# Patient Record
Sex: Female | Born: 1950 | Race: White | Hispanic: No | Marital: Single | Smoking: Never smoker
Health system: Southern US, Community
[De-identification: ages and names within clinical notes are randomized; demographics above are authoritative.]

## PROBLEM LIST (undated history)

## (undated) DIAGNOSIS — I10 Essential (primary) hypertension: Secondary | ICD-10-CM

## (undated) DIAGNOSIS — F319 Bipolar disorder, unspecified: Secondary | ICD-10-CM

## (undated) DIAGNOSIS — M1990 Unspecified osteoarthritis, unspecified site: Secondary | ICD-10-CM

## (undated) HISTORY — PX: ABDOMINAL HYSTERECTOMY: SHX81

---

## 2003-04-15 ENCOUNTER — Encounter: Payer: Self-pay | Admitting: Emergency Medicine

## 2003-04-15 ENCOUNTER — Emergency Department (HOSPITAL_COMMUNITY): Admission: EM | Admit: 2003-04-15 | Discharge: 2003-04-15 | Payer: Self-pay | Admitting: Emergency Medicine

## 2017-11-04 ENCOUNTER — Emergency Department (HOSPITAL_COMMUNITY): Payer: Medicare Other

## 2017-11-04 ENCOUNTER — Other Ambulatory Visit: Payer: Self-pay

## 2017-11-04 ENCOUNTER — Observation Stay (HOSPITAL_COMMUNITY)
Admission: EM | Admit: 2017-11-04 | Discharge: 2017-11-05 | Disposition: A | Discharge status: Home / Self Care | Payer: Medicare Other | Attending: Family Medicine | Admitting: Family Medicine

## 2017-11-04 ENCOUNTER — Encounter (HOSPITAL_COMMUNITY): Payer: Self-pay

## 2017-11-04 DIAGNOSIS — E785 Hyperlipidemia, unspecified: Secondary | ICD-10-CM

## 2017-11-04 DIAGNOSIS — F319 Bipolar disorder, unspecified: Secondary | ICD-10-CM

## 2017-11-04 DIAGNOSIS — I10 Essential (primary) hypertension: Secondary | ICD-10-CM | POA: Diagnosis not present

## 2017-11-04 DIAGNOSIS — R4781 Slurred speech: Secondary | ICD-10-CM | POA: Insufficient documentation

## 2017-11-04 DIAGNOSIS — R531 Weakness: Secondary | ICD-10-CM | POA: Diagnosis present

## 2017-11-04 DIAGNOSIS — G459 Transient cerebral ischemic attack, unspecified: Principal | ICD-10-CM | POA: Diagnosis present

## 2017-11-04 DIAGNOSIS — R110 Nausea: Secondary | ICD-10-CM | POA: Insufficient documentation

## 2017-11-04 DIAGNOSIS — R29810 Facial weakness: Secondary | ICD-10-CM | POA: Insufficient documentation

## 2017-11-04 DIAGNOSIS — F419 Anxiety disorder, unspecified: Secondary | ICD-10-CM

## 2017-11-04 HISTORY — DX: Bipolar disorder, unspecified: F31.9

## 2017-11-04 HISTORY — DX: Essential (primary) hypertension: I10

## 2017-11-04 HISTORY — DX: Unspecified osteoarthritis, unspecified site: M19.90

## 2017-11-04 LAB — APTT: APTT: 32 s (ref 24–36)

## 2017-11-04 LAB — COMPREHENSIVE METABOLIC PANEL
ALBUMIN: 3.6 g/dL (ref 3.5–5.0)
ALT: 16 U/L (ref 14–54)
AST: 17 U/L (ref 15–41)
Alkaline Phosphatase: 56 U/L (ref 38–126)
Anion gap: 9 (ref 5–15)
BUN: 19 mg/dL (ref 6–20)
CHLORIDE: 102 mmol/L (ref 101–111)
CO2: 27 mmol/L (ref 22–32)
CREATININE: 1.31 mg/dL — AB (ref 0.44–1.00)
Calcium: 9.7 mg/dL (ref 8.9–10.3)
GFR calc non Af Amer: 42 mL/min — ABNORMAL LOW (ref >60)
GFR, EST AFRICAN AMERICAN: 48 mL/min — AB (ref >60)
GLUCOSE: 83 mg/dL (ref 65–99)
Potassium: 3.5 mmol/L (ref 3.5–5.1)
SODIUM: 138 mmol/L (ref 135–145)
Total Bilirubin: 0.4 mg/dL (ref 0.3–1.2)
Total Protein: 7.1 g/dL (ref 6.5–8.1)

## 2017-11-04 LAB — CBC
HCT: 42.3 % (ref 36.0–46.0)
Hemoglobin: 13.5 g/dL (ref 12.0–15.0)
MCH: 30.2 pg (ref 26.0–34.0)
MCHC: 31.9 g/dL (ref 30.0–36.0)
MCV: 94.6 fL (ref 78.0–100.0)
PLATELETS: 302 10*3/uL (ref 150–400)
RBC: 4.47 MIL/uL (ref 3.87–5.11)
RDW: 12.4 % (ref 11.5–15.5)
WBC: 13.1 10*3/uL — AB (ref 4.0–10.5)

## 2017-11-04 LAB — I-STAT CHEM 8, ED
BUN: 23 mg/dL — ABNORMAL HIGH (ref 6–20)
CHLORIDE: 99 mmol/L — AB (ref 101–111)
Calcium, Ion: 1.14 mmol/L — ABNORMAL LOW (ref 1.15–1.40)
Creatinine, Ser: 1.3 mg/dL — ABNORMAL HIGH (ref 0.44–1.00)
GLUCOSE: 82 mg/dL (ref 65–99)
HCT: 42 % (ref 36.0–46.0)
Hemoglobin: 14.3 g/dL (ref 12.0–15.0)
POTASSIUM: 3.6 mmol/L (ref 3.5–5.1)
Sodium: 141 mmol/L (ref 135–145)
TCO2: 32 mmol/L (ref 22–32)

## 2017-11-04 LAB — DIFFERENTIAL
BASOS ABS: 0.1 10*3/uL (ref 0.0–0.1)
BASOS PCT: 1 %
Eosinophils Absolute: 0.6 10*3/uL (ref 0.0–0.7)
Eosinophils Relative: 5 %
Lymphocytes Relative: 22 %
Lymphs Abs: 2.9 10*3/uL (ref 0.7–4.0)
Monocytes Absolute: 0.9 10*3/uL (ref 0.1–1.0)
Monocytes Relative: 7 %
NEUTROS ABS: 8.4 10*3/uL — AB (ref 1.7–7.7)
NEUTROS PCT: 65 %

## 2017-11-04 LAB — I-STAT TROPONIN, ED: Troponin i, poc: 0 ng/mL (ref 0.00–0.08)

## 2017-11-04 LAB — PROTIME-INR
INR: 0.97
PROTHROMBIN TIME: 12.7 s (ref 11.4–15.2)

## 2017-11-04 MED ORDER — LOSARTAN POTASSIUM-HCTZ 100-25 MG PO TABS: 1.0000 | ORAL_TABLET | Freq: Every day | ORAL | Status: DC

## 2017-11-04 MED ORDER — SODIUM CHLORIDE 0.9 % IV SOLN
INTRAVENOUS | Status: DC
  Administered 2017-11-05: 01:00:00 via INTRAVENOUS

## 2017-11-04 MED ORDER — FAMOTIDINE IN NACL 20-0.9 MG/50ML-% IV SOLN
20.0000 mg | Freq: Once | INTRAVENOUS | Status: AC
  Administered 2017-11-04: 20 mg via INTRAVENOUS
  Filled 2017-11-04: qty 50

## 2017-11-04 MED ORDER — STROKE: EARLY STAGES OF RECOVERY BOOK
Freq: Once | Status: AC
  Administered 2017-11-05: 01:00:00
  Filled 2017-11-04: qty 1

## 2017-11-04 MED ORDER — SENNOSIDES-DOCUSATE SODIUM 8.6-50 MG PO TABS: 1.0000 | ORAL_TABLET | Freq: Every evening | ORAL | Status: DC | PRN

## 2017-11-04 MED ORDER — LAMOTRIGINE 100 MG PO TABS
200.0000 mg | ORAL_TABLET | Freq: Every day | ORAL | Status: DC
  Administered 2017-11-05: 200 mg via ORAL
  Filled 2017-11-04: qty 2

## 2017-11-04 MED ORDER — HYDROCHLOROTHIAZIDE 25 MG PO TABS
25.0000 mg | ORAL_TABLET | Freq: Every day | ORAL | Status: DC
  Administered 2017-11-05 (×2): 25 mg via ORAL
  Filled 2017-11-04 (×2): qty 1

## 2017-11-04 MED ORDER — ENOXAPARIN SODIUM 40 MG/0.4ML ~~LOC~~ SOLN
40.0000 mg | SUBCUTANEOUS | Status: DC
  Administered 2017-11-05: 40 mg via SUBCUTANEOUS
  Filled 2017-11-04: qty 0.4

## 2017-11-04 MED ORDER — TRAZODONE HCL 100 MG PO TABS
200.0000 mg | ORAL_TABLET | Freq: Every day | ORAL | Status: DC
Start: 2017-11-04 — End: 2017-11-05
  Administered 2017-11-05: 200 mg via ORAL
  Filled 2017-11-04: qty 2

## 2017-11-04 MED ORDER — ACETAMINOPHEN 650 MG RE SUPP: 650.0000 mg | RECTAL | Status: DC | PRN

## 2017-11-04 MED ORDER — SIMVASTATIN 20 MG PO TABS
20.0000 mg | ORAL_TABLET | Freq: Every day | ORAL | Status: DC
  Administered 2017-11-05: 20 mg via ORAL
  Filled 2017-11-04: qty 1

## 2017-11-04 MED ORDER — HYDROCHLOROTHIAZIDE 25 MG PO TABS: 25.0000 mg | ORAL_TABLET | Freq: Every day | ORAL | Status: DC

## 2017-11-04 MED ORDER — ASPIRIN EC 325 MG PO TBEC
325.0000 mg | DELAYED_RELEASE_TABLET | Freq: Once | ORAL | Status: DC
  Filled 2017-11-04: qty 1

## 2017-11-04 MED ORDER — ASPIRIN EC 81 MG PO TBEC
81.0000 mg | DELAYED_RELEASE_TABLET | Freq: Every day | ORAL | Status: DC
  Filled 2017-11-04: qty 1

## 2017-11-04 MED ORDER — ACETAMINOPHEN 325 MG PO TABS
650.0000 mg | ORAL_TABLET | ORAL | Status: DC | PRN
  Filled 2017-11-04: qty 2

## 2017-11-04 MED ORDER — ACETAMINOPHEN 160 MG/5ML PO SOLN: 650.0000 mg | ORAL | Status: DC | PRN

## 2017-11-04 MED ORDER — LORAZEPAM 2 MG/ML IJ SOLN
0.5000 mg | Freq: Once | INTRAMUSCULAR | Status: AC
  Administered 2017-11-04: 0.5 mg via INTRAVENOUS
  Filled 2017-11-04: qty 1

## 2017-11-04 MED ORDER — VENLAFAXINE HCL ER 75 MG PO CP24
150.0000 mg | ORAL_CAPSULE | Freq: Every day | ORAL | Status: DC
  Administered 2017-11-05: 150 mg via ORAL
  Filled 2017-11-04: qty 2
  Filled 2017-11-04: qty 1

## 2017-11-04 NOTE — ED Provider Notes (Signed)
[facility] Provider Note   CSN: [id number] Arrival date & time: 11/04/17  1726   An emergency department physician performed an initial assessment on this suspected stroke patient at 1746.  History   Chief Complaint Chief Complaint  Patient presents with  . Code Stroke    HPI Lisa LastLinda M Enfield is a 66 y.o. female.  Lisa Brewer is a 66 y.o. Female who presented to the emergency department as a code stroke.  Patient presented to the emergency department complaining of left-sided arm and leg weakness and tingling for the past 30 minutes.  Patient also reports she may have had some trouble speaking as she believes her sister was having trouble understanding her. RN in triage noted some left sided facial droop with some left arm and leg weakness.  Patient reports she is currently feeling back around her baseline.  She tells me she was trying to check out at the grocery store with her sister when she began feeling like she could not move her left arm and leg like it was supposed to.  She reports her left leg was not doing what she was telling it to do.  She reports she does have some arthritic pain, and currently feels around her baseline.  She also complains of some slight nausea due to being upset and stressed about coming to the emergency department.  She tells me she usually takes Zantac with relief of her symptoms. She denies history of stroke.  She is not on anticoagulants.  She does not take aspirin daily.  She is from outside the area.  She denies fevers, recent illness, coughing, chest pain, shortness of breath, vomiting, diarrhea, rashes, changes to her vision.    The history is provided by the patient and medical records. No language interpreter was used.    Past Medical History:  Diagnosis Date  . Arthritis   . Bipolar 1 disorder (HCC)   . Hypertension     There are no active problems to display for this patient.   Past Surgical  History:  Procedure Laterality Date  . ABDOMINAL HYSTERECTOMY      OB History    No data available       Home Medications    Prior to Admission medications   Not on File    Family History No family history on file.  Social History Social History   Tobacco Use  . Smoking status: Never Smoker  . Smokeless tobacco: Never Used  Substance Use Topics  . Alcohol use: No    Frequency: Never  . Drug use: No     Allergies   Codeine and Penicillins   Review of Systems Review of Systems  Constitutional: Negative for chills and fever.  HENT: Negative for congestion and sore throat.   Eyes: Negative for visual disturbance.  Respiratory: Negative for cough and shortness of breath.   Cardiovascular: Negative for chest pain.  Gastrointestinal: Negative for abdominal pain, diarrhea, nausea and vomiting.  Genitourinary: Negative for dysuria.  Musculoskeletal: Negative for back pain and neck pain.  Skin: Negative for rash.  Neurological: Positive for weakness (resolved ) and headaches. Negative for dizziness, syncope and light-headedness.     Physical Exam Updated Vital Signs BP (!) 166/98 (BP Location: Left Arm)   Pulse 79   Temp 97.9 F (36.6 C) (Oral)   Resp 16   Ht 5\' 2"  (1.575 m)   Wt 90.7 kg (200 lb)   SpO2 100%  BMI 36.58 kg/m   Physical Exam  Constitutional: She is oriented to person, place, and time. She appears well-developed and well-nourished. No distress.  HENT:  Head: Normocephalic and atraumatic.  Mouth/Throat: Oropharynx is clear and moist.  Eyes: Conjunctivae and EOM are normal. Pupils are equal, round, and reactive to light. Right eye exhibits no discharge. Left eye exhibits no discharge.  Neck: Neck supple. No JVD present. No tracheal deviation present.  Cardiovascular: Normal rate, regular rhythm, normal heart sounds and intact distal pulses. Exam reveals no gallop and no friction rub.  No murmur heard. Pulmonary/Chest: Effort normal and  breath sounds normal. No respiratory distress. She has no wheezes. She has no rales.  Abdominal: Soft. There is no tenderness.  Musculoskeletal: She exhibits no edema or deformity.  Lymphadenopathy:    She has no cervical adenopathy.  Neurological: She is alert and oriented to person, place, and time. No cranial nerve deficit or sensory deficit. She exhibits normal muscle tone. Coordination normal.  Patient is alert and oriented 3. Speech is clear and coherent. Cranial nerves are intact. Sensation is intact to bilateral upper and lower extremities.  Some difficulty with leg raise on the left, which patient reports is due to pain.  She reports this is at her baseline.  She is good strength plantar dorsiflexion bilaterally.  Upper extremities have good strength bilaterally.  Good grip strength bilaterally.  EOMs are intact.  Vision is grossly intact.  Finger to nose intact bilaterally.  No pronator drift.  Skin: Skin is warm and dry. Capillary refill takes less than 2 seconds. No rash noted. She is not diaphoretic. No erythema. No pallor.  Psychiatric: She has a normal mood and affect. Her behavior is normal.  Nursing note and vitals reviewed.    ED Treatments / Results  Labs (all labs ordered are listed, but only abnormal results are displayed) Labs Reviewed  CBC - Abnormal; Notable for the following components:      Result Value   WBC 13.1 (*)    All other components within normal limits  DIFFERENTIAL - Abnormal; Notable for the following components:   Neutro Abs 8.4 (*)    All other components within normal limits  COMPREHENSIVE METABOLIC PANEL - Abnormal; Notable for the following components:   Creatinine, Ser 1.31 (*)    GFR calc non Af Amer 42 (*)    GFR calc Af Amer 48 (*)    All other components within normal limits  I-STAT CHEM 8, ED - Abnormal; Notable for the following components:   Chloride 99 (*)    BUN 23 (*)    Creatinine, Ser 1.30 (*)    Calcium, Ion 1.14 (*)    All  other components within normal limits  PROTIME-INR  APTT  I-STAT TROPONIN, ED  CBG MONITORING, ED    EKG  EKG Interpretation None       Radiology Ct Head Code Stroke Wo Contrast  Result Date: 11/04/2017 CLINICAL DATA:  Code stroke.  LEFT-sided weakness and facial droop. EXAM: CT HEAD WITHOUT CONTRAST TECHNIQUE: Contiguous axial images were obtained from the base of the skull through the vertex without intravenous contrast. COMPARISON:  None. FINDINGS: BRAIN: No intraparenchymal hemorrhage, mass effect nor midline shift. The ventricles and sulci are normal for age. Patchy supratentorial white matter hypodensities less than expected for patient's age, though non-specific are most compatible with chronic small vessel ischemic disease. 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 No acute large vascular territory infarcts. No abnormal extra-axial fluid collections.  Basal cisterns are patent. VASCULAR: Mild calcific atherosclerosis of the carotid siphons. No dense MCA. SKULL: No skull fracture. No significant scalp soft tissue swelling. SINUSES/ORBITS: The mastoid air-cells and included paranasal sinuses are well-aerated.The included ocular globes and orbital contents are non-suspicious. OTHER: None. ASPECTS [facility]([place] Stroke Program Early CT Score) - Ganglionic level infarction (caudate, lentiform nuclei, internal capsule, insula, M1-M3 cortex): 7 - Supraganglionic infarction (M4-M6 cortex): 3 Total score (0-10 with 10 being normal): 10 IMPRESSION: 1. 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 2. ASPECTS is 10. 3. Acute findings text paged to Dr.Lindzen, Neurology via AMION secure system on 11/04/2017 at 6:07 pm. Electronically Signed   By: Awilda Metroourtnay  Bloomer M.D.   On: 11/04/2017 18:08    Procedures Procedures (including critical care time)  Medications Ordered in ED Medications  famotidine (PEPCID) IVPB 20 mg premix (20 mg Intravenous New  Bag/Given 11/04/17 1837)  LORazepam (ATIVAN) injection 0.5 mg (not administered)     Initial Impression / Assessment and Plan / ED Course  I have reviewed the triage vital signs and the nursing notes.  Pertinent labs & imaging results that were available during my care of the patient were reviewed by me and considered in my medical decision making (see chart for details).    This is a 66 y.o. Female who presented to the emergency department as a code stroke.  Patient presented to the emergency department complaining of left-sided arm and leg weakness and tingling for the past 30 minutes.  Patient also reports she may have had some trouble speaking as she believes her sister was having trouble understanding her. RN in triage noted some left sided facial droop with some left arm and leg weakness.  Patient reports she is currently feeling back around her baseline.  She tells me she was trying to check out at the grocery store with her sister when she began feeling like she could not move her left arm and leg like it was supposed to.  She reports her left leg was not doing what she was telling it to do.  She reports she does have some arthritic pain, and currently feels around her baseline.   Patient's initial NIH stroke scale is 3.  On my exam patient is afebrile nontoxic-appearing.  No evidence of any focal neurological deficits.  She does have some difficulty with testing strength to her left leg.  Patient reports this is her baseline and is due to pain and arthritis.  She reports earlier her left leg was not doing what she told it to do.  No evidence of any focal neurological deficits on my exam.  CT head without contrast shows an old appearing small right cerebellar infarct and otherwise a normal noncontrast head CT. Blood work is remarkable for a creatinine of 1.30.  No baseline to compare.  Patient is from out of the area. Neurologist Dr. Otelia LimesLindzen recommends TIA workup and admission.  Patient  agrees with plan for further workup.  I consulted with family medicine teaching service who accepted the patient for admission.  This patient was discussed with Dr. Verdie MosherLiu who agrees with assessment and plan.   Final Clinical Impressions(s) / ED Diagnoses   Final diagnoses:  TIA (transient ischemic attack)    ED Discharge Orders    None       Everlene FarrierDansie, Gilda Abboud, PA-C 11/04/17 1857    Lavera GuiseLiu, Dana Duo, MD 11/05/17 1415

## 2017-11-04 NOTE — ED Notes (Signed)
Pt will be brought from MRI to Pod F. MRI aware

## 2017-11-04 NOTE — H&P (Signed)
Family Medicine Teaching [facility] Admission History and Physical Service Pager: [phone]  Patient name: Lisa Brewer Medical record number: [id number] Date of birth: [birth year] Age: 66 y.o. Gender: female  Primary Care Provider: No primary care provider on file. Consultants: neurology  Code Status: partial; will take CPR and meds, refuses intubation. **Refuses blood transfusions** Confirmed this admission  Chief Complaint: L face, arm, leg weakness  Assessment and Plan: Lisa Brewer is a 66 y.o. female presenting with focal left sided arm and leg paresthesias and weakness and left sided facial droop . PMH is significant for arthritis, bipolar 1 disorder, HLD and HTN   TIA vs Stroke, new  Presents with Left facial paresthesias and ?facial droop, slurred speech noted by family member, Left arm and leg paresthesias and weakness. Came to ED as code stroke, Neuro on board. MR brain demonstrates no acute intracranial process, though mildly motion degraded. 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 CT head showing old right cerebellar infarct but is otherwise normal. RF for stroke include tobacco history, HTN, HLD. BP to 166/98 upon admission. Neurology consulted, recommending inpatient admission for TIA work up.  - admit as telemetry under attending Dr. Lum Babe  - vitals per floor protocol - neuro following; appreciate recs - monitor on telemetry - echo - carotid dopplers - risk strat labs: hgb A1C, lipid, TSH - swallow eval - completed in ED - PT/OT - continue home statin - ASA 81 mg Qd  AKI - Cr 1.30 on admission. No previous Cr readings to determine baseline. Mildly hypervolemic with 1+edema LE bilaterally, patient says is baseline. - gentle fluids 50 cc/hr x 12 hours - monitor on daily BMET  HTN - BP elevated 166/98 on admission. No evidence of new ischemia on imaging, and no mention of permissive HTN in neuro note. Will continue home antihypertensive meds to be restarted in AM. -  continue HCTZ 25 mg, hold lisinopril for AKI  Arthritis - Holding home meloxicam for AKI. - can add back as AKI resolves - tylenol PRN pain in the meantime  Bipolar 1 disorder/Fibromyalgia/Insomnia  - home meds include effexor 150 mg PO daily with breakfast, trazodone 200 mg daily at bedtime, lamotrigine 200 mg daily at bedtime. - continue home psych meds - hold home tramadol, can add back if requested  FEN/GI: NPO pending swallow study  Prophylaxis: lovenox  Disposition: admit to neuro/tele obs  History of Present Illness:  Lisa Brewer is a 66 y.o. female presenting with left-sided facial paresthesias and facial droop, left-sided arm and leg paresthesias and weakness. Patient was in her usual state of health until this afternoon when she noticed sudden onset of left-sided paresthesias and weakness. She is with her sister at the time of onset and her sister noted slurred speech, though her sentences still made sense. The patient felt slightly confused/out of it and dizzy during this time, she had nausea and felt like she might pass out. The entire episode lasted 35-40 minutes per the patient. She had no dilatations, no chest pain, no loss of urine, no loss of consciousness. She endorses feeling generally tired over the last few days. She denies history of known stroke in the past, stating that she's had similar symptoms about 2 years ago that went away in about 15 minutes. She has no known history of heart disease. Denies vomiting. Denies changes in urination such as urinary frequency or urgency. She denies constipation, notes 2 episodes of soft stools in the last two days.  Review Of Systems:  Per HPI.  ROS  Patient Active Problem List   Diagnosis Date Noted  . TIA (transient ischemic attack) 11/04/2017    Past Medical History: Past Medical History:  Diagnosis Date  . Arthritis   . Bipolar 1 disorder (HCC)   . Hypertension     Past Surgical History: Past Surgical History:   Procedure Laterality Date  . ABDOMINAL HYSTERECTOMY      Social History: Social History   Tobacco Use  . Smoking status: Previous smoker, quit 8 years ago   . Smokeless tobacco: Never Used  Substance Use Topics  . Alcohol use: No    Frequency: Never  . Drug use: No     Family History: No family history on file.   Allergies and Medications: Allergies  Allergen Reactions  . Codeine Anaphylaxis and Rash  . Penicillins Other (See Comments)    Has patient had a PCN reaction causing immediate rash, facial/tongue/throat swelling, SOB or lightheadedness with hypotension: No Has patient had a PCN reaction causing severe rash involving mucus membranes or skin necrosis: No Has patient had a PCN reaction that required hospitalization: No Has patient had a PCN reaction occurring within the last 10 years: No If all of the above answers are "NO", then may proceed with Cephalosporin use.   No current facility-administered medications on file prior to encounter.    Current Outpatient Medications on File Prior to Encounter  Medication Sig Dispense Refill  . Cholecalciferol (VITAMIN D PO) Take 2 tablets by mouth.     . lamoTRIgine (LAMICTAL) 200 MG tablet Take 200 mg by mouth at bedtime.     Marland Kitchen. losartan-hydrochlorothiazide (HYZAAR) 100-25 MG tablet Take 1 tablet by mouth daily.    . meloxicam (MOBIC) 15 MG tablet Take 15 mg by mouth daily.    . simvastatin (ZOCOR) 20 MG tablet Take 20 mg by mouth daily.    . TraMADol HCl 100 MG CP24 Take 100 mg by mouth daily as needed.    . traZODone (DESYREL) 100 MG tablet Take 200 mg by mouth at bedtime.    Marland Kitchen. venlafaxine XR (EFFEXOR XR) 150 MG 24 hr capsule Take 150 mg by mouth daily with breakfast.      Objective: BP (!) 172/76 (BP Location: Left Arm)   Pulse 70   Temp 97.9 F (36.6 C) (Oral)   Resp 18   Ht 5\' 2"  (1.575 m)   Wt 200 lb (90.7 kg)   SpO2 100%   BMI 36.58 kg/m  Exam: General: NAD, rests comfortably in bed, speaks to us  appropriately Eyes: PERRL, EOMI, no conjunctival erythema or pallor, no scleral icterus ENTM: MMM, no pharyngeal erythema or exudate Neck: Full range of motion, no lymphadenopathy Cardiovascular: Regular rate and rhythm, no murmurs rubs or gallops Respiratory: Normal work of breathing, good effort, clear to auscultation bilaterally Gastrointestinal: Soft, nondistended, mildly tender in epigastric region to deep palpation MSK: Moves 4 extremities equally Derm: No rashes or lesions appreciated Neuro: CN II through XII intact, + decreased sensation endorsed over left face, arm, leg. 4/5 grip strength in left compared to 5/5 in right. 4/5 LUE strength, 5/5 LUE strength. 4/5 LLE strength, 5/5 LLE strength Psych: AAOx4, appropriate affect, thought process linear, speech clear and unslurred  Labs and Imaging: CBC BMET  Recent Labs  Lab 11/04/17 1735 11/04/17 1754  WBC 13.1*  --   HGB 13.5 14.3  HCT 42.3 42.0  PLT 302  --    Recent Labs  Lab 11/04/17 1735 11/04/17  1754  NA 138 141  K 3.5 3.6  CL 102 99*  CO2 27  --   BUN 19 23*  CREATININE 1.31* 1.30*  GLUCOSE 83 82  CALCIUM 9.7  --      EKG  - QTc 495, RA enlarged, old anteroseptal infarct, old  Mr Angiogram Head Wo Contrast  Result Date: 11/04/2017 CLINICAL DATA:  LEFT-sided weakness and tingling for 30 minutes. EXAM: MRI HEAD WITHOUT CONTRAST MRA HEAD WITHOUT CONTRAST TECHNIQUE: Multiplanar, multiecho pulse sequences of the brain and surrounding structures were obtained without intravenous contrast. Angiographic images of the head were obtained using MRA technique without contrast. COMPARISON:  CT HEAD November 04, 2017 at 1759 hours FINDINGS: MRI HEAD FINDINGS- mildly motion degraded examination. BRAIN: No reduced diffusion to suggest acute ischemia or hyperacute demyelination. No susceptibility artifact to suggest hemorrhage. The ventricles and sulci are normal for patient's age. Old small RIGHT cerebellar infarcts. Scattered  subcentimeter supratentorial white matter FLAIR T2 hyperintensities compatible with mild chronic small vessel ischemic disease, normal for age. No suspicious parenchymal signal, mass or mass effect. No abnormal extra-axial fluid collections. VASCULAR: Normal major intracranial vascular flow voids present at skull base. SKULL AND UPPER CERVICAL SPINE: No abnormal sellar expansion. No suspicious calvarial bone marrow signal. Craniocervical junction maintained. SINUSES/ORBITS: The mastoid air-cells and included paranasal sinuses are well-aerated. The included ocular globes and orbital contents are non-suspicious. OTHER: None. MRA HEAD FINDINGS- mildly motion degraded examination. ANTERIOR CIRCULATION: Normal flow related enhancement of the included cervical, petrous, cavernous and supraclinoid internal carotid arteries. Patent anterior communicating artery. Patent anterior and middle cerebral arteries, mild luminal regularity. No large vessel occlusion, flow limiting stenosis, aneurysm. POSTERIOR CIRCULATION: vertebral artery is dominant. Basilar artery is patent, with normal flow related enhancement of the main branch vessels. Patent posterior cerebral arteries. Small bilateral posterior communicating artery's present. No large vessel occlusion, flow limiting stenosis,  aneurysm. ANATOMIC VARIANTS: None. Source images and MIP images were reviewed. IMPRESSION: MRI HEAD: 1. No acute intracranial process on this mildly motion degraded examination. 2. Old small RIGHT cerebellar infarct, otherwise negative noncontrast MRI of the head for age. MRA HEAD: 1. No emergent large vessel occlusion or flow limiting stenosis. 2. Mild intracranial atherosclerosis and/or motion artifact. Electronically Signed   By: Awilda Metroourtnay  Bloomer M.D.   On: 11/04/2017 20:23   Mr Brain Wo Contrast  Result Date: 11/04/2017 CLINICAL DATA:  LEFT-sided weakness and tingling for 30 minutes. EXAM: MRI HEAD WITHOUT CONTRAST MRA HEAD WITHOUT CONTRAST  TECHNIQUE: Multiplanar, multiecho pulse sequences of the brain and surrounding structures were obtained without intravenous contrast. Angiographic images of the head were obtained using MRA technique without contrast. COMPARISON:  CT HEAD November 04, 2017 at 1759 hours FINDINGS: MRI HEAD FINDINGS- mildly motion degraded examination. BRAIN: No reduced diffusion to suggest acute ischemia or hyperacute demyelination. No susceptibility artifact to suggest hemorrhage. The ventricles and sulci are normal for patient's age. Old small RIGHT cerebellar infarcts. Scattered subcentimeter supratentorial white matter FLAIR T2 hyperintensities compatible with mild chronic small vessel ischemic disease, normal for age. No suspicious parenchymal signal, mass or mass effect. No abnormal extra-axial fluid collections. VASCULAR: Normal major intracranial vascular flow voids present at skull base. SKULL AND UPPER CERVICAL SPINE: No abnormal sellar expansion. No suspicious calvarial bone marrow signal. Craniocervical junction maintained. SINUSES/ORBITS: The mastoid air-cells and included paranasal sinuses are well-aerated. The included ocular globes and orbital contents are non-suspicious. OTHER: None. MRA HEAD FINDINGS- mildly motion degraded examination. ANTERIOR CIRCULATION: Normal flow related enhancement of  the included cervical, petrous, cavernous and supraclinoid internal carotid arteries. Patent anterior communicating artery. Patent anterior and middle cerebral arteries, mild luminal regularity. No large vessel occlusion, flow limiting stenosis, aneurysm. POSTERIOR CIRCULATION: vertebral artery is dominant. Basilar artery is patent, with normal flow related enhancement of the main branch vessels. Patent posterior cerebral arteries. Small bilateral posterior communicating artery's present. No large vessel occlusion, flow limiting stenosis,  aneurysm. ANATOMIC VARIANTS: None. Source images and MIP images were reviewed.  IMPRESSION: MRI HEAD: 1. No acute intracranial process on this mildly motion degraded examination. 2. Old small RIGHT cerebellar infarct, otherwise negative noncontrast MRI of the head for age. MRA HEAD: 1. No emergent large vessel occlusion or flow limiting stenosis. 2. Mild intracranial atherosclerosis and/or motion artifact. Electronically Signed   By: Awilda Metro M.D.   On: 11/04/2017 20:23   Ct Head Code Stroke Wo Contrast  Result Date: 11/04/2017 CLINICAL DATA:  Code stroke.  LEFT-sided weakness and facial droop. EXAM: CT HEAD WITHOUT CONTRAST TECHNIQUE: Contiguous axial images were obtained from the base of the skull through the vertex without intravenous contrast. COMPARISON:  None. FINDINGS: BRAIN: No intraparenchymal hemorrhage, mass effect nor midline shift. The ventricles and sulci are normal for age. Patchy supratentorial white matter hypodensities less than expected for patient's age, though non-specific are most compatible with chronic small vessel ischemic disease. 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 No acute large vascular territory infarcts. No abnormal extra-axial fluid collections. Basal cisterns are patent. VASCULAR: Mild calcific atherosclerosis of the carotid siphons. No dense MCA. SKULL: No skull fracture. No significant scalp soft tissue swelling. SINUSES/ORBITS: The mastoid air-cells and included paranasal sinuses are well-aerated.The included ocular globes and orbital contents are non-suspicious. OTHER: None. ASPECTS [facility] Stroke Program Early CT Score) - Ganglionic level infarction (caudate, lentiform nuclei, internal capsule, insula, M1-M3 cortex): 7 - Supraganglionic infarction (M4-M6 cortex): 3 Total score (0-10 with 10 being normal): 10 IMPRESSION: 1. 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 2. ASPECTS is 10. 3. Acute findings text paged to Dr.Lindzen, Neurology via AMION secure system on 11/04/2017 at 6:07 pm.  Electronically Signed   By: Awilda Metro M.D.   On: 11/04/2017 18:08    Howard Pouch, MD 11/04/2017, 10:49 PM PGY-2, [place] [facility] FPTS Intern pager: [phone], text pages welcome

## 2017-11-04 NOTE — ED Notes (Signed)
No family for pt.

## 2017-11-04 NOTE — ED Notes (Signed)
Admitting Provider at bedside. 

## 2017-11-04 NOTE — Code Documentation (Signed)
Patient was at [employer] this afternoon at the check out counter when her leg would not "work right".  LKW at 1704.  She arrived via private vehicle at 1726  Patient assessed in triage: Left facial droop, left arm drift and left leg drift.  Code Stroke called.  Stat head CT and labs done.  Dr Otelia LimesLindzen assessed patient while in CT.  Symptoms are rapidly improving.  She now has some mild drift in her left leg and some decreased sensory.  She also endorses pain in hip when moving left leg and has been recently diagnosed with arthritis.   Per Dr Otelia LimesLindzen plan admit for stroke work up.

## 2017-11-04 NOTE — ED Notes (Signed)
Attempted report to 3W but nurse not ready to take report. Left name and number with Diplomatic Services operational officersecretary.

## 2017-11-04 NOTE — ED Notes (Signed)
Code stroke cancelled at 1817 by Dr. Wadie LessenLinden Pt states she feels much better, has some pain when moving left leg; states she has recently (1 week) been diagnosed with arthritis and has had increased pain in her left hip

## 2017-11-04 NOTE — ED Triage Notes (Signed)
Pt arrives to ED POV with complaints of left sided weakness and tingling x 30 mins. Pt friend was with pt and states symptoms began at [id number]. Code stroke activated in triage.

## 2017-11-04 NOTE — ED Notes (Signed)
Attempted report to 3W x2 and after explaining protocol for bedside reporting was instructed that the nurse was with a patient and would be calling me back

## 2017-11-04 NOTE — Consult Note (Signed)
Referring Physician: Dr. Oleta Mouse    Chief Complaint: Left leg weakness and left facial droop  HPI: Lisa Brewer is an 66 y.o. female who was out shopping at [employer] today when she suddenly developed LLE weakness. "I couldn't control my leg" states patient. She also had left facial droop and left sided tingling. Symptoms began at 1704, which is also her LKN. In triage, she had left facial droop, left arm drift and left leg drift. Code Stroke was called.  After CT head was obtained, the patient stated that she felt much better, but had some pain when moving her left leg. She also states that states she has recently (1 week) been diagnosed with arthritis and has had increased pain in her left hip. She denies a prior history of stroke.   LSN: [id number] tPA Given: No: Rapid improvement with mild residual LLE weakness. Benefits of tPA are felt to be outweighed by risks. Discussed with patient who expressed desire not to proceed with tPA.   Past Medical History:  Diagnosis Date  . Arthritis   . Bipolar 1 disorder ([place])   . Hypertension     Past Surgical History:  Procedure Laterality Date  . ABDOMINAL HYSTERECTOMY      No family history on file. Social History:  reports that  has never smoked. she has never used smokeless tobacco. She reports that she does not drink alcohol or use drugs.  Allergies:  Allergies  Allergen Reactions  . Penicillins Other (See Comments)    Has patient had a PCN reaction causing immediate rash, facial/tongue/throat swelling, SOB or lightheadedness with hypotension: No Has patient had a PCN reaction causing severe rash involving mucus membranes or skin necrosis: No Has patient had a PCN reaction that required hospitalization: No Has patient had a PCN reaction occurring within the last 10 years: No If all of the above answers are "NO", then may proceed with Cephalosporin use.    Medications: None listed in EPIC  ROS: No field cut. States vision dimmed  bilaterally for a short period of time during her episode. No difficulty speaking or swallowing. No confusion. Other ROS as per HPI.   Physical Examination: Blood pressure (!) 166/98, pulse 79, temperature 97.9 F (36.6 C), temperature source Oral, resp. rate 16, height '5\' 2"'$  (1.575 m), weight 90.7 kg (200 lb), SpO2 100 %.  HEENT: [place]/AT Lungs: Respirations unlabored Ext: Warm and well perfused  Neurologic Examination: Mental Status: Alert, oriented, thought content appropriate.  Speech fluent without evidence of aphasia.  Able to follow all commands without difficulty. No dysarthria or scanning speech.   Cranial Nerves: II:  Visual fields intact. PERRL.  III,IV, VI: Ptosis not present. EOMI. No nystagmus.  V,VII: Smile symmetric. Facial temp sensation normal bilaterally VIII: hearing intact to voice IX,X: Palate rises symmetrically XI: Symmetric XII: midline tongue extension  Motor: RUE: 5/5 proximal and distal LUE: 5/5 proximal and distal RLE: 5/5 proximal and distal LLE: 4/5 proximal and distal with some limitation due to pain.  Normal tone throughout; no atrophy noted Sensory: Decreased temp sensation to LLE and LUE. FT intact x 4. No extinction.   Deep Tendon Reflexes:  1-2+ in all 4 extremities without asymmetry.  Plantars: Right: downgoing  Left: downgoing Cerebellar: No ataxia with FNF bilaterally. Has difficulty performing heel-shin bilaterally due to pain, left worse than right.  Gait: Deferred.   Results for orders placed or performed during the hospital encounter of 11/04/17 (from the past 48 hour(s))  Protime-INR  Status: None   Collection Time: 11/04/17  5:35 PM  Result Value Ref Range   Prothrombin Time 12.7 11.4 - 15.2 seconds   INR 0.97   APTT     Status: None   Collection Time: 11/04/17  5:35 PM  Result Value Ref Range   aPTT 32 24 - 36 seconds  CBC     Status: Abnormal   Collection Time: 11/04/17  5:35 PM  Result Value Ref Range   WBC 13.1 (H) 4.0 -  10.5 K/uL   RBC 4.47 3.87 - 5.11 MIL/uL   Hemoglobin 13.5 12.0 - 15.0 g/dL   HCT 42.3 36.0 - 46.0 %   MCV 94.6 78.0 - 100.0 fL   MCH 30.2 26.0 - 34.0 pg   MCHC 31.9 30.0 - 36.0 g/dL   RDW 12.4 11.5 - 15.5 %   Platelets 302 150 - 400 K/uL  Differential     Status: Abnormal   Collection Time: 11/04/17  5:35 PM  Result Value Ref Range   Neutrophils Relative % 65 %   Neutro Abs 8.4 (H) 1.7 - 7.7 K/uL   Lymphocytes Relative 22 %   Lymphs Abs 2.9 0.7 - 4.0 K/uL   Monocytes Relative 7 %   Monocytes Absolute 0.9 0.1 - 1.0 K/uL   Eosinophils Relative 5 %   Eosinophils Absolute 0.6 0.0 - 0.7 K/uL   Basophils Relative 1 %   Basophils Absolute 0.1 0.0 - 0.1 K/uL  Comprehensive metabolic panel     Status: Abnormal   Collection Time: 11/04/17  5:35 PM  Result Value Ref Range   Sodium 138 135 - 145 mmol/L   Potassium 3.5 3.5 - 5.1 mmol/L   Chloride 102 101 - 111 mmol/L   CO2 27 22 - 32 mmol/L   Glucose, Bld 83 65 - 99 mg/dL   BUN 19 6 - 20 mg/dL   Creatinine, Ser 1.31 (H) 0.44 - 1.00 mg/dL   Calcium 9.7 8.9 - 10.3 mg/dL   Total Protein 7.1 6.5 - 8.1 g/dL   Albumin 3.6 3.5 - 5.0 g/dL   AST 17 15 - 41 U/L   ALT 16 14 - 54 U/L   Alkaline Phosphatase 56 38 - 126 U/L   Total Bilirubin 0.4 0.3 - 1.2 mg/dL   GFR calc non Af Amer 42 (L) >60 mL/min   GFR calc Af Amer 48 (L) >60 mL/min    Comment: (NOTE) The eGFR has been calculated using the CKD EPI equation. This calculation has not been validated in all clinical situations. eGFR's persistently <60 mL/min signify possible Chronic Kidney Disease.    Anion gap 9 5 - 15  I-stat troponin, ED     Status: None   Collection Time: 11/04/17  5:52 PM  Result Value Ref Range   Troponin i, poc 0.00 0.00 - 0.08 ng/mL   Comment 3            Comment: Due to the release kinetics of cTnI, a negative result within the first hours of the onset of symptoms does not rule out myocardial infarction with certainty. If myocardial infarction is still  suspected, repeat the test at appropriate intervals.   I-Stat Chem 8, ED     Status: Abnormal   Collection Time: 11/04/17  5:54 PM  Result Value Ref Range   Sodium 141 135 - 145 mmol/L   Potassium 3.6 3.5 - 5.1 mmol/L   Chloride 99 (L) 101 - 111 mmol/L   BUN 23 (H)  6 - 20 mg/dL   Creatinine, Ser 1.30 (H) 0.44 - 1.00 mg/dL   Glucose, Bld 82 65 - 99 mg/dL   Calcium, Ion 1.14 (L) 1.15 - 1.40 mmol/L   TCO2 32 22 - 32 mmol/L   Hemoglobin 14.3 12.0 - 15.0 g/dL   HCT 42.0 36.0 - 46.0 %   Ct Head Code Stroke Wo Contrast  Result Date: 11/04/2017 CLINICAL DATA:  Code stroke.  LEFT-sided weakness and facial droop. EXAM: CT HEAD WITHOUT CONTRAST TECHNIQUE: Contiguous axial images were obtained from the base of the skull through the vertex without intravenous contrast. COMPARISON:  None. FINDINGS: BRAIN: No intraparenchymal hemorrhage, mass effect nor midline shift. The ventricles and sulci are normal for age. Patchy supratentorial white matter hypodensities less than expected for patient's age, though non-specific are most compatible with chronic small vessel ischemic disease. 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 No acute large vascular territory infarcts. No abnormal extra-axial fluid collections. Basal cisterns are patent. VASCULAR: Mild calcific atherosclerosis of the carotid siphons. No dense MCA. SKULL: No skull fracture. No significant scalp soft tissue swelling. SINUSES/ORBITS: The mastoid air-cells and included paranasal sinuses are well-aerated.The included ocular globes and orbital contents are non-suspicious. OTHER: None. ASPECTS [facility] Stroke Program Early CT Score) - Ganglionic level infarction (caudate, lentiform nuclei, internal capsule, insula, M1-M3 cortex): 7 - Supraganglionic infarction (M4-M6 cortex): 3 Total score (0-10 with 10 being normal): 10 IMPRESSION: 1. 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 2. ASPECTS is 10. 3. Acute  findings text paged to [place], Neurology via AMION secure system on 11/04/2017 at 6:07 pm. Electronically Signed   By: Elon Alas M.D.   On: 11/04/2017 18:08    Assessment: 66 y.o. female with acute onset of LLE weakness and left facial droop with left sided paresthesias 1. CT head reveals no acute abnormality. There is a chronic-appearing small RIGHT cerebellar infarct, otherwise normal noncontrast CT HEAD for age.  2. Following CT head, the patient had mild residual LLE weakness and subjective sensory deficit. She stated that she was significantly improved but still not at baseline.  3. Stroke Risk Factors - HTN.   Plan: 1. The patient exhibits rapid improvement with mild residual LLE weakness. Benefits of tPA are felt to be outweighed by risks. Discussed with patient the risks of hemorrhage and possible worse permanent deficit than her current LLE weakness and subjective sensory impairment if given tPA versus possible improvement of her relatively mild deficit if given tPA. Following discussion of risks/benefits, the patient agreed with the plan and expressed desire not to proceed with tPA.  2. MRI, MRA of the brain without contrast 3. PT consult, OT consult, Speech consult 4. Echocardiogram 5. Carotid dopplers 6. Start ASA 81 mg po qd. Administer 650 mg x 1 now.  7. Risk factor modification 8. Telemetry monitoring 9. Frequent neuro checks 10. Following full stroke work up, decision will be made regarding possible initiation of a statin.  11. HgbA1c, fasting lipid panel  '@Electronically'$  signed: Dr. Kerney Elbe  11/04/2017, 6:49 PM

## 2017-11-05 ENCOUNTER — Observation Stay (HOSPITAL_BASED_OUTPATIENT_CLINIC_OR_DEPARTMENT_OTHER): Payer: Medicare Other

## 2017-11-05 ENCOUNTER — Encounter (HOSPITAL_COMMUNITY): Payer: Self-pay | Admitting: *Deleted

## 2017-11-05 DIAGNOSIS — F319 Bipolar disorder, unspecified: Secondary | ICD-10-CM | POA: Diagnosis not present

## 2017-11-05 DIAGNOSIS — F419 Anxiety disorder, unspecified: Secondary | ICD-10-CM | POA: Diagnosis not present

## 2017-11-05 DIAGNOSIS — I10 Essential (primary) hypertension: Secondary | ICD-10-CM | POA: Diagnosis not present

## 2017-11-05 DIAGNOSIS — E785 Hyperlipidemia, unspecified: Secondary | ICD-10-CM

## 2017-11-05 DIAGNOSIS — G459 Transient cerebral ischemic attack, unspecified: Secondary | ICD-10-CM | POA: Diagnosis not present

## 2017-11-05 LAB — LIPID PANEL
Cholesterol: 150 mg/dL (ref 0–200)
HDL: 66 mg/dL (ref >40)
LDL Cholesterol: 62 mg/dL (ref 0–99)
Total CHOL/HDL Ratio: 2.3 ratio
Triglycerides: 110 mg/dL (ref <150)
VLDL: 22 mg/dL (ref 0–40)

## 2017-11-05 LAB — BASIC METABOLIC PANEL WITH GFR
Anion gap: 8 (ref 5–15)
BUN: 18 mg/dL (ref 6–20)
CO2: 30 mmol/L (ref 22–32)
Calcium: 9.1 mg/dL (ref 8.9–10.3)
Chloride: 102 mmol/L (ref 101–111)
Creatinine, Ser: 1.31 mg/dL — ABNORMAL HIGH (ref 0.44–1.00)
GFR calc Af Amer: 48 mL/min — ABNORMAL LOW (ref >60)
GFR calc non Af Amer: 42 mL/min — ABNORMAL LOW (ref >60)
Glucose, Bld: 91 mg/dL (ref 65–99)
Potassium: 3.3 mmol/L — ABNORMAL LOW (ref 3.5–5.1)
Sodium: 140 mmol/L (ref 135–145)

## 2017-11-05 LAB — CBC
HCT: 39 % (ref 36.0–46.0)
Hemoglobin: 12.1 g/dL (ref 12.0–15.0)
MCH: 29.3 pg (ref 26.0–34.0)
MCHC: 31 g/dL (ref 30.0–36.0)
MCV: 94.4 fL (ref 78.0–100.0)
PLATELETS: 273 10*3/uL (ref 150–400)
RBC: 4.13 MIL/uL (ref 3.87–5.11)
RDW: 12.4 % (ref 11.5–15.5)
WBC: 11.4 10*3/uL — AB (ref 4.0–10.5)

## 2017-11-05 LAB — HIV ANTIBODY (ROUTINE TESTING W REFLEX): HIV Screen 4th Generation wRfx: NONREACTIVE

## 2017-11-05 LAB — HEMOGLOBIN A1C
Hgb A1c MFr Bld: 5.5 % (ref 4.8–5.6)
Mean Plasma Glucose: 111.15 mg/dL

## 2017-11-05 LAB — VITAMIN B12: Vitamin B-12: 476 pg/mL (ref 180–914)

## 2017-11-05 LAB — ECHOCARDIOGRAM COMPLETE
HEIGHTINCHES: 62 in
WEIGHTICAEL: 3114.66 [oz_av]

## 2017-11-05 LAB — TSH: TSH: 3.756 u[IU]/mL (ref 0.350–4.500)

## 2017-11-05 MED ORDER — POTASSIUM CHLORIDE CRYS ER 20 MEQ PO TBCR
40.0000 meq | EXTENDED_RELEASE_TABLET | Freq: Once | ORAL | Status: AC
  Administered 2017-11-05: 40 meq via ORAL
  Filled 2017-11-05: qty 2

## 2017-11-05 MED ORDER — LIDOCAINE 5 % EX PTCH: 2.0000 | MEDICATED_PATCH | CUTANEOUS | 0 refills | Status: AC

## 2017-11-05 MED ORDER — FAMOTIDINE 20 MG PO TABS
20.0000 mg | ORAL_TABLET | Freq: Two times a day (BID) | ORAL | Status: DC
  Administered 2017-11-05: 20 mg via ORAL
  Filled 2017-11-05: qty 1

## 2017-11-05 MED ORDER — LIDOCAINE 5 % EX PTCH
2.0000 | MEDICATED_PATCH | CUTANEOUS | Status: DC
  Administered 2017-11-05: 2 via TRANSDERMAL
  Filled 2017-11-05: qty 2

## 2017-11-05 MED ORDER — TRAMADOL HCL 50 MG PO TABS
100.0000 mg | ORAL_TABLET | Freq: Every day | ORAL | Status: DC | PRN
  Administered 2017-11-05: 100 mg via ORAL
  Filled 2017-11-05: qty 2

## 2017-11-05 NOTE — Progress Notes (Signed)
3W RN Called ED back  to get report on pt  at 2249 11/04/17  and then at 2320  and was transferred to a nurse  Monique twice who said she did not have the  Pt but will let the pt's ED  nurse  Huntley DecSara  call unit 3 W RN back to give report. ED RN never call back until pt brought to the unit at 2357 and she  gave bedside report. to RN.

## 2017-11-05 NOTE — Progress Notes (Signed)
Preliminary results by tech - Carotid Duplex Completed. No evidence of a significant stenosis in bilateral carotid arteries. Danea Manter, BS, RDMS, RVT  

## 2017-11-05 NOTE — Progress Notes (Signed)
Family Medicine Teaching Service Daily Progress Note Intern Pager: [phone]  Patient name: Lisa LastLinda M Strnad Medical record number: [id number] Date of birth: 07/09/1951 Age: 66 y.o. Gender: female  Primary Care Provider: No primary care provider on file. Consultants: Neurology Code Status: Partial  Pt Overview and Major Events to Date:  Lisa Brewer is a 66 y.o. female presenting with focal left sided arm and leg paresthesias and weakness and left sided facial droop . PMH is significant for arthritis, bipolar 1 disorder, HLD and HTN   Assessment and Plan:  TIA vs Stroke: New. Acute. Improved. Presented with Left facial paresthesias and questionable facial droop, slurred speech noted by family member, Left arm and leg paresthesias and weakness. These have all resolved. She still has some mild residual sensation and tactile dyssymmetry feeling more on the right than the left. MRI brain demonstrates no acute intracranial process, though mildly motion degraded. 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 CT head showing old right cerebellar infarct but is otherwise normal. RF for stroke include tobacco history, HTN, HLD. BP to 166/98 upon admission. Neurology consulted, recommending inpatient admission for TIA work up.HgbA1c 5.5, lipid panel at goal, TSH normal - neuro following; appreciate recs - monitor on telemetry - echo pending - carotid dopplers pending - swallow eval - pending note - PT/OT recommend outpatient PT - continue home statin simvastatin 20mg , consider increasing outpatient - Cont ASA 81 mg daily  AKI: Acute. Stable. Cr 1.30 on admission. No previous Cr readings to determine baseline. Mildly hypervolemic with 1+edema LE bilaterally, patient says is baseline. - gentle fluids 50 cc/hr x 12 hours - monitor on daily BMET  HTN - BP elevated 166/98 on admission. No evidence of new ischemia on imaging, and no mention of permissive HTN in neuro note. Will continue home antihypertensive meds to be  restarted in AM. - cont to monitor - continue HCTZ 25 mg, hold lisinopril for AKI  Arthritis - Holding home meloxicam for AKI. Patient stated this am Tylenol hurts her stomach. - can add back as AKI resolves - Adding back home tramadol as needed  Bipolar 1 disorder/Fibromyalgia/Insomnia  - home meds include effexor 150 mg PO daily with breakfast, trazodone 200 mg daily at bedtime, lamotrigine 200 mg daily at bedtime. - continue home psych meds - Could consider amitriptyline for Fibromyalgia   FEN/GI: NPO pending swallow study PPx: Lovenox  Disposition: Telemetry/Obs  Subjective:  Patient states she feels better this am. She is still endorsing some difference in feeling with her right side "feeling more strong" compared to her left. This is unchanged from yesterday. She is not unsteady but does complain that her knees are hurting her. She would like to go home if possible today.  Objective: Temp:  [97.5 F (36.4 C)-98.8 F (37.1 C)] 98.8 F (37.1 C) (12/19 0600) Pulse Rate:  [70-80] 80 (12/19 0600) Resp:  [16-18] 16 (12/19 0600) BP: (142-172)/(58-98) 143/68 (12/19 0600) SpO2:  [92 %-100 %] 92 % (12/19 0600) Weight:  [194 lb 10.7 oz (88.3 kg)-200 lb (90.7 kg)] 194 lb 10.7 oz (88.3 kg) (12/19 0000) Physical Exam: Gen: Alert and Oriented x 3, NAD HEENT: Normocephalic, atraumatic, PERRLA, EOMI,  CV: RRR, no murmurs, normal S1, S2 split, +2 pulses dorsalis pedis bilaterally Resp: CTAB, no wheezing, rales, or rhonchi, comfortable work of breathing Abd: non-distended, non-tender, soft, +bs in all four quadrants MSK: FROM in all four extremities Ext: no clubbing, cyanosis, or edema Neuro: CN II-XII grossly intact, sensation asymmetrical with more sensation on the  right than the left side Skin: warm, dry, intact, no rashes  Laboratory: Recent Labs  Lab 11/04/17 1735 11/04/17 1754 11/05/17 0632  WBC 13.1*  --  11.4*  HGB 13.5 14.3 12.1  HCT 42.3 42.0 39.0  PLT 302  --  273    Recent Labs  Lab 11/04/17 1735 11/04/17 1754 11/05/17 0632  NA 138 141 140  K 3.5 3.6 3.3*  CL 102 99* 102  CO2 27  --  30  BUN 19 23* 18  CREATININE 1.31* 1.30* 1.31*  CALCIUM 9.7  --  9.1  PROT 7.1  --   --   BILITOT 0.4  --   --   ALKPHOS 56  --   --   ALT 16  --   --   AST 17  --   --   GLUCOSE 83 82 91   12/18 - Vit B12: pending 12/18 - TSH: 3.756 12/18 - UDS: pending 12/18 - Lipid Panel: 150 cholesterol, 110 triglyc, HDL 66, LDL 62 12/18 - HgbA1c: 5.5 12/18 - EKG: OTc 495, RA enlarged, old anteroseptal infarct, NSR  Imaging/Diagnostic Tests: 12/18 - CT Head w/o Contrast: IMPRESSION: 1. 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 2. ASPECTS is 10.  12/18 - MR Brain & MRA Head: IMPRESSION: MRI Head: 1. No acute intracranial process on this mildly motion degraded examination. 2. Old small RIGHT cerebellar infarct, otherwise negative noncontrast MRI of the head for age. MRA HEAD: 1. No emergent large vessel occlusion or flow limiting stenosis. 2. Mild intracranial atherosclerosis and/or motion artifact.  Carotid Dopplers: pending  Echo: pending  Arlyce HarmanLockamy, Danalee Flath, DO 11/05/2017, 8:02 AM PGY-1, [facility] FPTS Intern pager: [phone], text pages welcome

## 2017-11-05 NOTE — Evaluation (Signed)
Occupational Therapy Evaluation Patient Details Name: Lisa Brewer MRN: [id number] DOB: 09-29-1951 Today's Date: 11/05/2017    History of Present Illness 66 y.o. female presents with left-sided weakness and numbness. 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 MD suspects TIA. PMH significant of HTN, HLD, arthritis, and bipolar 1 disorder.   Clinical Impression   PTA, pt was living alone and was independent with ADLs, IADLs, driving, and working with a Economist. Currently, pt requires Min Guard for ADLs and functional mobility due to decreased balance. Provided education and handout fall prevention in home. Answered all pt questions. Recommend dc home once medically stable per physician with follow up at OP OT to increase safety and independence with ADLs, IADLs, and functional mobility. All acute OT needs met and will sign off. Thank you.     Follow Up Recommendations  Outpatient OT    Equipment Recommendations  None recommended by OT    Recommendations for Other Services PT consult     Precautions / Restrictions Precautions Precautions: Fall Restrictions Weight Bearing Restrictions: No      Mobility Bed Mobility Overal bed mobility: Needs Assistance Bed Mobility: Sit to Supine       Sit to supine: Supervision   General bed mobility comments: Pt OOB bathing upon arrival  Transfers Overall transfer level: Needs assistance Equipment used: None Transfers: Sit to/from Stand Sit to Stand: Supervision         General transfer comment: supervision for safety    Balance Overall balance assessment: Needs assistance Sitting-balance support: No upper extremity supported;Feet supported Sitting balance-Leahy Scale: Good Sitting balance - Comments: pt able to shift weight forwards and backwards without external support and use UEs freely.      Standing balance-Leahy Scale: Fair Standing balance comment: Pt requiring  MIn Guard for safety and had 2-3 LOB during dynamic balance during bathing.                           ADL either performed or assessed with clinical judgement   ADL Overall ADL's : Needs assistance/impaired Eating/Feeding: Set up;Supervision/ safety;Sitting   Grooming: Set up;Supervision/safety;Standing   Upper Body Bathing: Min guard;Standing   Lower Body Bathing: Min guard;Sit to/from stand Lower Body Bathing Details (indicate cue type and reason): Min Guard for safety. Pt with 2-3 small LOB during LB bathing where pt grabbed bed rail and self correct Upper Body Dressing : Min guard;Standing   Lower Body Dressing: Min guard;Sit to/from stand Lower Body Dressing Details (indicate cue type and reason): Pt pulling on underwear and pants with Min Guard for safety Toilet Transfer: Min guard;Ambulation(Simulated at recliner)           Functional mobility during ADLs: Min guard General ADL Comments: Pt performing ADLs at [facility] level with decreased balance. Pt near baseline funciton but has several LOB during dynamic balance.      Vision Baseline Vision/History: No visual deficits Patient Visual Report: No change from baseline       Perception     Praxis      Pertinent Vitals/Pain Pain Assessment: Faces Faces Pain Scale: Hurts a little bit Pain Location: right lower back Pain Descriptors / Indicators: Aching Pain Intervention(s): Monitored during session;Repositioned     Hand Dominance Right   Extremity/Trunk Assessment Upper Extremity Assessment Upper Extremity Assessment: Generalized weakness   Lower Extremity Assessment Lower Extremity Assessment: Defer to PT evaluation;RLE deficits/detail RLE Deficits /  Details: Radiating pain down back of RLE   Cervical / Trunk Assessment Cervical / Trunk Assessment: Normal;Other exceptions(Lower back pain)   Communication Communication Communication: No difficulties   Cognition Arousal/Alertness:  Awake/alert Behavior During Therapy: WFL for tasks assessed/performed Overall Cognitive Status: Within Functional Limits for tasks assessed                                     General Comments  Pt reports that she is the oldest of 3 siblings and she looks out for them. Pt reports that she is currently caring for one sister who lives with her and is going to transition her to a facility in the near future.     Exercises     Shoulder Instructions      Home Living Family/patient expects to be discharged to:: Private residence Living Arrangements: Other relatives(sister) Available Help at Discharge: Family;Available PRN/intermittently Type of Home: House Home Access: Stairs to enter [employer] of Steps: 4 Entrance Stairs-Rails: Right Home Layout: One level     Bathroom Shower/Tub: Occupational psychologist: Handicapped height     Home Equipment: Environmental consultant - 2 wheels;Cane - single point          Prior Functioning/Environment Level of Independence: Independent        Comments: Pt performing ADLs, IADLs, driving, and works with Economist.         OT Problem List: Impaired balance (sitting and/or standing);Decreased knowledge of use of DME or AE;Pain      OT Treatment/Interventions:      OT Goals(Current goals can be found in the care plan section) Acute Rehab OT Goals Patient Stated Goal: Go home OT Goal Formulation: All assessment and education complete, DC therapy  OT Frequency:     Barriers to D/C:            Co-evaluation              AM-PAC PT "6 Clicks" Daily Activity     Outcome Measure Help from another person eating meals?: None Help from another person taking care of personal grooming?: A Little Help from another person toileting, which includes using toliet, bedpan, or urinal?: A Little Help from another person bathing (including washing, rinsing, drying)?: A Little Help from another person to put on and  taking off regular upper body clothing?: A Little Help from another person to put on and taking off regular lower body clothing?: A Little 6 Click Score: 19   End of Session Equipment Utilized During Treatment: Gait belt Nurse Communication: Mobility status;Patient requests pain meds(IV beeping)  Activity Tolerance: Patient tolerated treatment well Patient left: in chair;with call bell/phone within reach(with MD)  OT Visit Diagnosis: Unsteadiness on feet (R26.81);Muscle weakness (generalized) (M62.81);Pain Pain - part of body: (Lower back)                Time: 6195-0932 OT Time Calculation (min): 22 min Charges:  OT General Charges $OT Visit: 1 Visit OT Evaluation $OT Eval Moderate Complexity: 1 Mod G-Codes: OT G-codes **NOT FOR INPATIENT CLASS** Functional Assessment Tool Used: Clinical judgement Functional Limitation: Self care Self Care Current Status (I7124): At least 1 percent but less than 20 percent impaired, limited or restricted Self Care Goal Status (P8099): 0 percent impaired, limited or restricted Self Care Discharge Status [phone]): At least 1 percent but less than 20 percent impaired, limited or restricted  [place], OTR/L Acute Rehab Pager: [phone] Office: [place] 11/05/2017, 9:55 AM

## 2017-11-05 NOTE — Progress Notes (Signed)
NEUROHOSPITALISTS STROKE TEAM - DAILY PROGRESS NOTE   ADMISSION HISTORY:  Lisa Brewer is an 66 y.o. female who was out shopping at [employer] today when she suddenly developed LLE weakness. "I couldn't control my leg" states patient. She also had left facial droop and left sided tingling. Symptoms began at 1704, which is also her LKN. In triage, she had left facial droop, left arm drift and left leg drift. Code Stroke was called.  After CT head was obtained, the patient stated that she felt much better, but had some pain when moving her left leg. She also states that states she has recently (1 week) been diagnosed with arthritis and has had increased pain in her left hip. She denies a prior history of stroke.   LSN: [id number] tPA Given: No: Rapid improvement with mild residual LLE weakness. Benefits of tPA are felt to be outweighed by risks. Discussed with patient who expressed desire not to proceed with tPA.   SUBJECTIVE (INTERVAL HISTORY) No family is at the bedside. Patient is found laying in bed in NAD. Overall she feels her condition is gradually improving. Voices no new complaints. No new events reported overnight.  OBJECTIVE Lab Results: CBC:  Recent Labs  Lab 11/04/17 1735 11/04/17 1754 11/05/17 0632  WBC 13.1*  --  11.4*  HGB 13.5 14.3 12.1  HCT 42.3 42.0 39.0  MCV 94.6  --  94.4  PLT 302  --  273   BMP: Recent Labs  Lab 11/04/17 1735 11/04/17 1754 11/05/17 0632  NA 138 141 140  K 3.5 3.6 3.3*  CL 102 99* 102  CO2 27  --  30  GLUCOSE 83 82 91  BUN 19 23* 18  CREATININE 1.31* 1.30* 1.31*  CALCIUM 9.7  --  9.1   Liver Function Tests:  Recent Labs  Lab 11/04/17 1735  AST 17  ALT 16  ALKPHOS 56  BILITOT 0.4  PROT 7.1  ALBUMIN 3.6   Thyroid Function Studies:  Recent Labs    11/05/17 0632  TSH 3.756   Coagulation Studies:  Recent Labs    11/04/17 1735  APTT 32  INR 0.97   PHYSICAL EXAM Temp:   [97.5 F (36.4 C)-98.8 F (37.1 C)] 98.5 F (36.9 C) (12/19 1358) Pulse Rate:  [70-80] 76 (12/19 1358) Resp:  [16-18] 18 (12/19 1358) BP: (142-172)/(58-98) 160/67 (12/19 1358) SpO2:  [92 %-100 %] 94 % (12/19 1358) Weight:  [88.3 kg (194 lb 10.7 oz)-90.7 kg (200 lb)] 88.3 kg (194 lb 10.7 oz) (12/19 0000) General - Well nourished, well developed, in no apparent distress Respiratory - Lungs clear bilaterally. No wheezing. Cardiovascular - Regular rate and rhythm   Neurologic Examination: Mental Status: Alert, oriented, thought content appropriate.  Speech fluent without evidence of aphasia.  Able to follow all commands without difficulty. No dysarthria or scanning speech.   Cranial Nerves: II:  Visual fields intact. PERRL.  III,IV, VI: Ptosis not present. EOMI. No nystagmus.  V,VII: Smile symmetric. Facial temp sensation normal bilaterally VIII: hearing intact to voice IX,X: Palate rises symmetrically XI: Symmetric XII: midline tongue extension  Motor: RUE: 5/5 proximal and distal LUE: 5/5 proximal and distal RLE: 5/5 proximal and distal LLE: 4/5 proximal and distal with some limitation due to pain.  Normal tone throughout; no atrophy noted Sensory: Decreased temp sensation to LLE and LUE. FT intact x 4. No extinction.   Deep Tendon Reflexes:  1-2+ in all 4 extremities without asymmetry.  Plantars: Right: downgoing  Left: downgoing Cerebellar: No ataxia with FNF bilaterally. Has difficulty performing heel-shin bilaterally due to pain, left worse than right.  Gait: Deferred.  IMAGING: I have personally reviewed the radiological images below and agree with the radiology interpretations.  MRI /MRA Brain Wo Contrast Result Date: 11/04/2017 IMPRESSION: MRI HEAD: 1. No acute intracranial process on this mildly motion degraded examination. 2. Old small RIGHT cerebellar infarct, otherwise negative noncontrast MRI of the head for age. MRA HEAD: 1. No emergent large vessel  occlusion or flow limiting stenosis. 2. Mild intracranial atherosclerosis and/or motion artifact. Electronically Signed   By: Awilda Metroourtnay  Bloomer M.D.   On: 11/04/2017 20:23   Ct Head Code Stroke Wo Contrast Result Date: 11/04/2017 IMPRESSION: 1. 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 2. ASPECTS is 10. 3. Acute findings text paged to Dr.Lindzen, Neurology via AMION secure system on 11/04/2017 at 6:07 pm. Electronically Signed   By: Awilda Metroourtnay  Bloomer M.D.   On: 11/04/2017 18:08   Echocardiogram:  Study Conclusions - Left ventricle: inferior basal hypokinesis. Abnormal septal   motion The cavity size was mildly dilated. Wall thickness was   increased in a pattern of mild LVH. Systolic function was normal.   The estimated ejection fraction was in the range of 50% to 55%.   Wall motion was normal; there were no regional wall motion   abnormalities. Left ventricular diastolic function parameters   were normal. - Aortic valve: There was mild to moderate regurgitation. Valve   area (VTI): 1.26 cm^2. Valve area (Vmax): 1.27 cm^2. Valve area   (Vmean): 1.62 cm^2.  B/L Carotid U/S:                                                 No evidence of a significant stenosis in bilateral carotid arteries     ASSESSMENT: Ms. Lisa Brewer is a 66 y.o. female with PMH of hypertension, arthritis and bipolar disorder admitted for acute onset of LLE weakness and left facial droop with left sided paresthesias.  Following discussion of risks/benefits, the patient agreed with the plan and expressed desire not to proceed with tPA. MRI is negative for acute stroke.  No emergent LVO.  CT head shows small old right cerebellar infarct  Likely TIA  R/O STROKE:  Suspected Etiology: Small vessel disease Resultant Symptoms: LLE weakness and left facial droop with left sided paresthesias Stroke Risk Factors: hypertension Other Stroke Risk Factors: Advanced age,  Obesity, Body mass index is 35.6 kg/m. ,  Hx stroke  Outstanding Stroke Work-up Studies:    Workup completed  11/05/2017: Neuro exam stable, nonfocal exam.  All imaging and lab testing reviewed with patient.  Continue aspirin and statin.  Patient will likely follow-up with neurology after [place] [facility].  PLAN  11/05/2017: Continue Aspirin/ Statin Frequent neuro checks Telemetry monitoring PT/OT/SLP Ongoing aggressive stroke risk factor management Patient counseled to be compliant with her antithrombotic medications Patient counseled on Lifestyle modifications including, Diet, Exercise, and Stress Follow up with PCP, [place]  PCP may decide if patient needs neurological follow-up in her area.  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 Long term BP goal normotensive. May slowly restart home B/P medications after 48 hours Home Meds: Hyzaar, Norvasc  HYPERLIPIDEMIA:    Component Value Date/Time   CHOL 150 11/05/2017 0405   TRIG 110 11/05/2017 0405   HDL 66 11/05/2017 0405   CHOLHDL 2.3 11/05/2017 0405   VLDL 22 11/05/2017 0405   LDLCALC 62 11/05/2017 0405  Home Meds: Zocor 20 LDL  goal < 70 Continued on Zocor 20 mg daily Continue statin at discharge  R/ O DIABETES: Lab Results  Component Value Date   HGBA1C 5.5 11/05/2017  HgbA1c goal < 7.0  OBESITY Obesity, Body mass index is 35.6 kg/m. Greater than/equal to 30  Other Active Problems: Active Problems:   TIA  (transient ischemic attack)  Hospital day # 0 VTE prophylaxis: Lovenox  Diet : Diet Carb Modified Fluid consistency: Thin; Room service appropriate? Yes   FAMILY UPDATES: No family at bedside  TEAM UPDATES: Doreene ElandEniola, Kehinde T, MD     Prior Home Stroke Medications:  No antithrombotic  Discharge Stroke Meds:  Please discharge patient on aspirin 81 mg daily   Disposition: Final discharge disposition not confirmed -likely home Therapy Recs:               Outpatient OT PT Home Equipment:         None Follow Up:  PCP Follow up in 1-2 weeks -patient will [place] [facility] [place]  Beryl MeagerMary A Costello, [place]NP-C Stroke Neurology Team 11/05/2017 2:00 PM  ATTENDING NOTE: I reviewed above note and agree with the assessment and plan. I have made any additions or clarifications directly to the above note. Pt was seen and examined.   66 year old female with history of hypertension, bipolar on Lamictal and Effexor admitted for episode of left-sided weakness, left facial droop, left-sided numbness and tingling.  Symptoms improved with only complaints of mild left-sided paresthesia at this time.  Also complains of back pain and bilateral knee pain.  MRI negative for acute stroke but only chronic right small cerebellar infarct.  MRA and carotid Doppler negative.  EF 50-55%.  LDL 62 and A1c 5.5.  CHF and TSH within normal limits.   Patient has really strange affect on rounding, seemed overly happy about her episode instead of worrying or anxious.  She still complains of left-sided 70% sensation compared to the right, however MRI was negative at this time.  Lack of significant stroke risk factor except silent small right cerebellar infarct on MRI.  Not convinced that her episode was TIA, but certainly that is a possibility.  DDX also include anxiety, conversion disorder, bipolar disorder.  Agreed to continue aspirin and Zocor for stroke prevention.  She will see her local neurologist for  further follow-up.  Neurology to sign off.  Please call with any further questions or concerns.  Thank you for this consultation.  Marvel PlanJindong Emmitt Matthews, MD PhD Stroke Neurology 11/05/2017 5:40 PM    To contact Stroke Continuity provider, please refer to [email or web address]. After hours, contact General Neurology

## 2017-11-05 NOTE — Progress Notes (Addendum)
Called cardiology Dr. Swazil[place]Jordan for ECHO findings of inferior basal hypokinesis and abnormal septal motion. Cardiology states patient is good to follow up as an outpatient, no need for further work up inpatient.

## 2017-11-05 NOTE — Discharge Instructions (Addendum)
Carotid Artery Disease The carotid arteries are arteries on both sides of the neck. They carry blood to the brain. Carotid artery disease is when the arteries get smaller (narrow) or get blocked. If these arteries get smaller or get blocked, you are more likely to have a stroke or warning stroke (transient ischemic attack). Follow these instructions at home:  Take medicines as told by your doctor. Make sure you understand all your medicine instructions. Do not stop your medicines without talking to your doctor first.  Follow your doctor's diet instructions. It is important to eat a healthy diet that includes plenty of: ? Fresh fruits. ? Vegetables. ? Lean meats.  Avoid: ? High-fat foods. ? High-sodium foods. ? Foods that are fried, overly processed, or have poor nutritional value.  Stay a healthy weight.  Stay active. Get at least 30 minutes of activity every day.  Do not smoke.  Limit alcohol use to: ? No more than 2 drinks a day for men. ? No more than 1 drink a day for women who are not pregnant.  Do not use illegal drugs.  Keep all doctor visits as told. Get help right away if:  You have sudden weakness or loss of feeling (numbness) on one side of the body, such as the face, arm, or leg.  You have sudden confusion.  You have trouble speaking (aphasia) or understanding.  You have sudden trouble seeing out of one or both eyes.  You have sudden trouble walking.  You have dizziness or feel like you might pass out (faint).  You have a loss of balance or your movements are not steady (uncoordinated).  You have a sudden, severe headache with no known cause.  You have trouble swallowing (dysphagia). Call your local emergency services (911 in [place] Do notdrive yourself to the clinic or hospital. This information is not intended to replace advice given to you by your health care provider. Make sure you discuss any questions you have with your health care  provider. Document Released: 10/21/2012 Document Revised: 04/11/2016 Document Reviewed: 05/05/2013 Elsevier Interactive Patient Education  2018 [employer]   Ischemic Stroke An ischemic stroke is the sudden death of brain tissue. Blood carries oxygen to all areas of the body. This type of stroke happens when your blood does not flow to your brain like normal. Your brain cannot get the oxygen it needs. This is an emergency. It must be treated right away. Symptoms of a stroke usually happen all of a sudden. You may notice them when you wake up. They can include:  Weakness or loss of feeling in your face, arm, or leg. This often happens on one side of the body.  Trouble walking.  Trouble moving your arms or legs.  Loss of balance or coordination.  Feeling confused.  Trouble talking or understanding what people are saying.  Slurred speech.  Trouble seeing.  Seeing two of one object (double vision).  Feeling dizzy.  Feeling sick to your stomach (nauseous) and throwing up (vomiting).  A very bad headache for no reason.  Get help as soon as any of these problems start. This is important. Some treatments work better if they are given right away. These include:  Aspirin.  Medicines to control blood pressure.  A shot (injection) of medicine to break up the blood clot.  Treatments given in the blood vessel (artery) to take out the clot or break it up.  Other treatments may include:  Oxygen.  Fluids given through an  IV tube.  Medicines to thin out your blood.  Procedures to help your blood flow better.  What increases the risk? Certain things may make you more likely to have a stroke. Some of these are things that you can change, such as:  Being very overweight (obesity).  Smoking.  Taking birth control pills.  Not being active.  Drinking too much alcohol.  Using drugs.  Other risk factors include:  High blood pressure.  High  cholesterol.  Diabetes.  Heart disease.  Being [place] American, Native [address], Hispanic, or [place] Native.  Being over age 35.  Family history of stroke.  Having had blood clots, stroke, or warning stroke (transient ischemic attack, TIA) in the past.  Sickle cell disease.  Being a woman with a history of high blood pressure in pregnancy (preeclampsia).  Migraine headache.  Sleep apnea.  Having an irregular heartbeat (atrial fibrillation).  Long-term (chronic) diseases that cause soreness and swelling (inflammation).  Disorders that affect how your blood clots.  Follow these instructions at home: Medicines  Take over-the-counter and prescription medicines only as told by your doctor.  If you were told to take aspirin or another medicine to thin your blood, take it exactly as told by your doctor. ? Taking too much of the medicine can cause bleeding. ? If you do not take enough, it may not work as well.  Know the side effects of your medicines. If you are taking a blood thinner, make sure you: ? Hold pressure over any cuts for longer than usual. ? Tell your dentist and other doctors that you take this medicine. ? Avoid activities that may cause damage or injury to your body. Eating and drinking  Follow instructions from your doctor about what you cannot eat or drink.  Eat healthy foods.  If you have trouble with swallowing, do these things to avoid choking: ? Take small bites when eating. ? Eat foods that are soft or pureed. Safety  Follow instructions from your health care team about physical activity.  Use a walker or cane as told by your doctor.  Keep your home safe so you do not fall. This may include: ? Having experts look at your home to make sure it is safe. ? Putting grab bars in the bedroom and bathroom. ? Using raised toilets. ? Putting a seat in the shower. General instructions  Do not use any tobacco products. ? Examples of these are  cigarettes, chewing tobacco, and e-cigarettes. ? If you need help quitting, ask your doctor.  Limit how much alcohol you drink. This means no more than 1 drink a day for nonpregnant women and 2 drinks a day for men. One drink equals 12 oz of beer, 5 oz of wine, or 1 oz of hard liquor.  If you need help to stop using drugs or alcohol, ask your doctor to refer you to a program or specialist.  Stay active. Exercise as told by your doctor.  Keep all follow-up visits as told by your doctor. This is important. Get help right away if:  You suddenly: ? Have weakness or loss of feeling in your face, arm, or leg. ? Feel confused. ? Have trouble talking or understanding what people are saying. ? Have trouble seeing. ? Have trouble walking. ? Have trouble moving your arms or legs. ? Feel dizzy. ? Lose your balance or coordination. ? Have a very bad headache and you do not know why.  You pass out (lose consciousness) or almost pass  out.  You have jerky movements that you cannot control (seizure). These symptoms may be an emergency. Do not wait to see if the symptoms will go away. Get medical help right away. Call your local emergency services (911 in the [place] Do not drive yourself to the hospital. This information is not intended to replace advice given to you by your health care provider. Make sure you discuss any questions you have with your health care provider. Document Released: 10/24/2011 Document Revised: 04/16/2016 Document Reviewed: 01/31/2016 Elsevier Interactive Patient Education  2018 [employer] You were admitted to the hospital due to concern for an acute stroke. The imaging you had was reassuring that you did not in fact have an acute stroke or a transient ischemic attack. Please have follow up with your primary care physician. You will also need to have your primary care physician set you up for a referral for cardiology

## 2017-11-05 NOTE — Evaluation (Signed)
Speech Language Pathology Evaluation Patient Details Name: Lisa Brewer MRN: [id number] DOB: 06/03/1951 Today's Date: 11/05/2017 Time: 1030-1050 SLP Time Calculation (min) (ACUTE ONLY): 20 min  Problem List:  Patient Active Problem List   Diagnosis Date Noted  . TIA (transient ischemic attack) 11/04/2017   Past Medical History:  Past Medical History:  Diagnosis Date  . Arthritis   . Bipolar 1 disorder (HCC)   . Hypertension    Past Surgical History:  Past Surgical History:  Procedure Laterality Date  . ABDOMINAL HYSTERECTOMY     HPI:  Lisa AlbinoLinda M Ildertonis a 66 y.o.femalepresenting with focal left sidedarm and leg paresthesias andweakness and left sided facial droop. PMH is significant for arthritis, bipolar 1 disorder,HLDand HTN. Presents with Left facial paresthesias and ?facial droop, slurred speech noted by family member, Left arm and leg paresthesias and weakness. Came to ED as code stroke, Neuro on board.MR brain demonstrates no acute intracranial process, though mildly motion degraded. Old small right infarct.CT head showing old right cerebellar infarct but is otherwise normal. RF for stroke includetobacco history, HTN, HLD. BP to166/98upon admission. Neurology consulted, recommending inpatient admission for TIA work up.MRI revealed no emergent large vessel occlusion or flow limiting stenosis with mild intracranial atherosclerosis and/or motion artifact.   Assessment / Plan / Recommendation Clinical Impression  Pt presents with functional cognitive linguistic skills. Pt able to recall testing from earlier in day, demonstrate awareness of current situation and demonstrate functional problem solving. No further skilled ST is required at this time. ST to sign off.     SLP Assessment  SLP Recommendation/Assessment: Patient does not need any further Speech Lanaguage Pathology Services SLP Visit Diagnosis: Cognitive communication deficit (R41.841)    Follow Up  Recommendations  None    Frequency and Duration           SLP Evaluation Cognition  Overall Cognitive Status: Within Functional Limits for tasks assessed Arousal/Alertness: Awake/alert Orientation Level: Oriented X4       Comprehension       Expression Expression Primary Mode of Expression: Verbal Verbal Expression Overall Verbal Expression: Appears within functional limits for tasks assessed Written Expression Dominant Hand: Right Written Expression: Within Functional Limits   Oral / Motor  Oral Motor/Sensory Function Overall Oral Motor/Sensory Function: Within functional limits Motor Speech Overall Motor Speech: Appears within functional limits for tasks assessed Respiration: Within functional limits   GO          Functional Assessment Tool Used: skilled clinical observations Functional Limitations: Spoken language comprehension Spoken Language Comprehension Current Status (W0981(G9159): 0 percent impaired, limited or restricted Spoken Language Comprehension Goal Status (X9147(G9160): 0 percent impaired, limited or restricted Spoken Language Comprehension Discharge Status [phone](G9161): 0 percent impaired, limited or restricted         Kennita Pavlovich 11/05/2017, 2:00 PM

## 2017-11-05 NOTE — Discharge Summary (Signed)
Family Medicine Teaching [facility] Discharge Summary  Patient name: Lisa Brewer Medical record number: [id number] Date of birth: 08/27/1951 Age: 66 y.o. Gender: female Date of Admission: 11/04/2017  Date of Discharge: 11/05/17  Admitting Physician: Howard PouchLauren Feng, MD  Primary Care Provider: No primary care provider on file. Consultants: Neurology  Indication for Hospitalization: focal left sidedarm and leg paresthesias andweakness and left sided facial droop  Discharge Diagnoses/Problem List:  TIA vs Stroke AKI HTN Arthritis Bipolar 1 disorder Fibromyalgia Insomnia  Disposition: stable  Discharge Condition: home  Discharge Exam:   Gen: Alert and Oriented x 3, NAD HEENT: Normocephalic, atraumatic, PERRLA, EOMI,  CV: RRR, no murmurs, normal S1, S2 split, +2 pulses dorsalis pedis bilaterally Resp: CTAB, no wheezing, rales, or rhonchi, comfortable work of breathing Abd: non-distended, non-tender, soft, +bs in all four quadrants MSK: FROM in all four extremities Ext: no clubbing, cyanosis, or edema Neuro: CN II-XII grossly intact, sensation asymmetrical with more sensation on the right than the left side Skin: warm, dry, intact, no rashes  Brief Hospital Course:  Lisa Brewer is a 66y/o female who presented to the ED due to left sided numbness tingling, weakness, questionable facial droop, and slurred speech, and difficulty using her left side to walk. She denies any confusion, memory loss. On arrival at the ED her symptoms had self-resolved expect some residual left sided sensation deficiet. She had a head CT w/o contrast that showed no acute intracranial process, follow up head MRI and MRA were also negative for any acute signs of stroke. It is still possible her episode was caused by a TIA but no imaging evidence of this as the cause of her symptoms.   She was also found to have an elevated serum Cr of 1.30 on admission. Unclear whether or not this was an AKI due  to not having a prior serum Cr from any previous admission but patient states no history of kidney disease or kidney problems. It was likely prerenal and she was given gentle IV fluids.   Her risk stratification for stroke labs came back normal and no changes in her medications were made except discontinuation of her meloxicam.    Issues for Follow Up:  1. Cardiology follow up for recent ECHO results. 2. Patient was taking meloxicam for chronic knee pain. I have discontinued this due to her serum Creatinine being elevated at 1.30.  Significant Procedures:   Echocardiogram: Left ventricle inferior basal hypokinesis with abnormal septal motion. LVH mildly dilated with LVEF of 50-55%. Mild Aortic Valve regurgitation.  Bilat. Carotid Dopplers: Right ICA 1-39% stenosis, Left ICA 1-39% stenosis  Significant Labs and Imaging:  Recent Labs  Lab 11/04/17 1735 11/04/17 1754 11/05/17 0632  WBC 13.1*  --  11.4*  HGB 13.5 14.3 12.1  HCT 42.3 42.0 39.0  PLT 302  --  273   Recent Labs  Lab 11/04/17 1735 11/04/17 1754 11/05/17 0632  NA 138 141 140  K 3.5 3.6 3.3*  CL 102 99* 102  CO2 27  --  30  GLUCOSE 83 82 91  BUN 19 23* 18  CREATININE 1.31* 1.30* 1.31*  CALCIUM 9.7  --  9.1  ALKPHOS 56  --   --   AST 17  --   --   ALT 16  --   --   ALBUMIN 3.6  --   --    12/18 - Vit B12: 476 12/18 - TSH: 3.756 12/18 - Lipid Panel: 150 cholesterol, 110 triglyc, HDL  66, LDL 62 12/18 - HgbA1c: 5.5 12/18 - EKG: OTc 495, RA enlarged, old anteroseptal infarct, NSR  ECHO:  Left ventricle: inferior basal hypokinesis. Abnormal septal motion The cavity size was mildly dilated. Wall thickness w/ mild LVH. Systolic function normal. EF 50%-55%. Left ventricular diastolic function parameters were normal.  Aortic valve: There was mild to moderate regurgitation. Valve area (VTI): 1.26 cm^2. Valve area (Vmax): 1.27 cm^2. Valve area (Vmean): 1.62 cm^2.  Results/Tests Pending at Time of Discharge:  none  Discharge Medications:  Allergies as of 11/05/2017      Reactions   Codeine Anaphylaxis, Rash   Penicillins Other (See Comments)   Has patient had a PCN reaction causing immediate rash, facial/tongue/throat swelling, SOB or lightheadedness with hypotension: No Has patient had a PCN reaction causing severe rash involving mucus membranes or skin necrosis: No Has patient had a PCN reaction that required hospitalization: No Has patient had a PCN reaction occurring within the last 10 years: No If all of the above answers are "NO", then may proceed with Cephalosporin use.      Medication List    STOP taking these medications   meloxicam 15 MG tablet Commonly known as:  MOBIC     TAKE these medications   EFFEXOR XR 150 MG 24 hr capsule Generic drug:  venlafaxine XR Take 150 mg by mouth daily with breakfast.   lamoTRIgine 200 MG tablet Commonly known as:  LAMICTAL Take 200 mg by mouth at bedtime.   lidocaine 5 % Commonly known as:  LIDODERM Place 2 patches onto the skin daily. Remove & Discard patch within 12 hours or as directed by MD Start taking on:  11/06/2017   losartan-hydrochlorothiazide 100-25 MG tablet Commonly known as:  HYZAAR Take 1 tablet by mouth daily.   simvastatin 20 MG tablet Commonly known as:  ZOCOR Take 20 mg by mouth daily.   TraMADol HCl 100 MG Cp24 Take 100 mg by mouth daily as needed.   traZODone 100 MG tablet Commonly known as:  DESYREL Take 200 mg by mouth at bedtime.   VITAMIN D PO Take 2 tablets by mouth.       Discharge Instructions: Please refer to Patient Instructions section of EMR for full details.  Patient was counseled important signs and symptoms that should prompt return to medical care, changes in medications, dietary instructions, activity restrictions, and follow up appointments.   Follow-Up Appointments:   Arlyce HarmanLockamy, Robson Trickey, DO 11/06/2017, 12:28 AM PGY-1, [facility]

## 2017-11-05 NOTE — Care Management Note (Signed)
Case Management Note  Patient Details  Name: Lisa Brewer MRN: [id number] Date of Birth: 10/02/1951  Subjective/Objective:      Pt in with TIA. [place].   No PCP listed but patient [place] but cant remember the MD's name.           Action/Plan: CM consulted for outpatient therapy. CM provided the patient with the orders/ prescription for outpatient PT/OT. She knows a rehab at the nearby hospital to call and set up the therapy upon returning home.  Pt states she has transportation home.   Expected Discharge Date:  11/05/17               Expected Discharge Plan:  OP Rehab  In-House Referral:     Discharge planning Services  CM Consult  Post Acute Care Choice:    Choice offered to:     DME Arranged:    DME Agency:     HH Arranged:    HH Agency:     Status of Service:  Completed, signed off  If discussed at [employer] Length of Stay Meetings, dates discussed:    Additional Comments:  Kermit BaloKelli F Mell Guia, RN 11/05/2017, 5:13 PM

## 2017-11-05 NOTE — Progress Notes (Signed)
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 Pt .alert and oriented x 4 .MAE with limited movement r/t to arthritis to bil lower extremities. . Pt denied weakness except numbness to left hand and fingers.. Oriented to staff and unit and cardiac monitor in use. SR.   Pt plan of care reviewed and pt demonstrated good understanding. No acute distress. RN will continue to monitor.

## 2017-11-05 NOTE — Progress Notes (Signed)
  Echocardiogram 2D Echocardiogram has been performed.  Lisa Brewer 11/05/2017, 10:27 AM

## 2017-11-05 NOTE — Evaluation (Signed)
Physical Therapy Evaluation/Discharge Patient Details Name: Lisa Brewer MRN: [id number] DOB: 12/04/1950 Today's Date: 11/05/2017   History of Present Illness  66 y.o. female presents with left-sided weakness and numbness. 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 MD suspects TIA. PMH significant of HTN, HLD, arthritis, and bipolar 1 disorder.  Clinical Impression  Pt presents with generalized muscle weakness, decreased balance, pain, and impaired mobility secondary to above. Pt demonstrates ambulation and stair negotiation without assistive devices with min guard assist for safety. Pt tends to reach for things to hold on to "for safety". Pt reports that her younger sister lives with her and pt cares for her. Pt independent prior to admission. Pt will benefit from outpatient PT upon discharge in order to improve upon aforementioned deficits. Skilled PT not needed acutely as pt is close to baseline level of functioning. Pt agrees and understands plan. Discharge from PT.    Follow Up Recommendations Outpatient PT    Equipment Recommendations  None recommended by PT    Recommendations for Other Services       Precautions / Restrictions Restrictions Weight Bearing Restrictions: No      Mobility  Bed Mobility Overal bed mobility: Needs Assistance Bed Mobility: Sit to Supine       Sit to supine: Supervision   General bed mobility comments: Pt in chair upon PT arrival, and in bed for transfer to test post-session. Pt transfers to supine in bed with increased time.  Transfers Overall transfer level: Needs assistance Equipment used: None Transfers: Sit to/from Stand Sit to Stand: Supervision         General transfer comment: x2 from chair. Pt uses UE support on IV pole to steady herself upon rising. Pt reports that she normally grabs onto things just to be safe. No LOB.   Ambulation/Gait Ambulation/Gait assistance: Min  guard Ambulation Distance (Feet): 150 Feet Assistive device: None Gait Pattern/deviations: Step-through pattern;Wide base of support;Decreased stride length Gait velocity: varied -> decreased to normal   General Gait Details: Pt reaches out for things (front desk counter) when available. Pt indicates that she also does this at home just to be safe.   Stairs Stairs: Yes Stairs assistance: Min guard Stair Management: One rail Right;Alternating pattern;Forwards;Step to pattern Number of Stairs: 4 General stair comments: Pt utilizes railing on right side for assistance with stair negotiation. Pt with alternating pattern on stair ascent and step-to pattern during stair descent. Pt reports slight increase in back pain with stair descent.   Wheelchair Mobility    Modified Rankin (Stroke Patients Only) Modified Rankin (Stroke Patients Only) Pre-Morbid Rankin Score: No symptoms Modified Rankin: Moderately severe disability     Balance Overall balance assessment: Needs assistance Sitting-balance support: No upper extremity supported;Feet supported Sitting balance-Leahy Scale: Good Sitting balance - Comments: pt able to shift weight forwards and backwards without external support and use UEs freely.      Standing balance-Leahy Scale: Fair Standing balance comment: Pt able to stand statically without support, but requiring min guard for safety during dynamic movement. pt reaches out for external support automatically when standing.                              Pertinent Vitals/Pain Pain Assessment: Faces Faces Pain Scale: Hurts a little bit Pain Location: right lower back Pain Descriptors / Indicators: Aching Pain Intervention(s): Limited activity within patient's tolerance;Monitored during session  Home Living Family/patient expects to be discharged to:: Private residence Living Arrangements: Other relatives(sister) Available Help at Discharge: Family;Available  PRN/intermittently Type of Home: House Home Access: Stairs to enter Entrance Stairs-Rails: Right Entrance Stairs-Number of Steps: 4 Home Layout: One level Home Equipment: Walker - 2 wheels;Cane - single point      Prior Function Level of Independence: Independent         Comments: Pt has DME from mother who passed 2 years ago, however she has never used any of it. Pt cares for her younger sister (lives with her) who is suffering from psychological issues. Pt does drive.      Hand Dominance   Dominant Hand: Right    Extremity/Trunk Assessment   Upper Extremity Assessment Upper Extremity Assessment: Defer to OT evaluation;Generalized weakness    Lower Extremity Assessment Lower Extremity Assessment: Generalized weakness    Cervical / Trunk Assessment Cervical / Trunk Assessment: Normal  Communication   Communication: No difficulties  Cognition Arousal/Alertness: Awake/alert Behavior During Therapy: WFL for tasks assessed/performed Overall Cognitive Status: Within Functional Limits for tasks assessed                                        General Comments General comments (skin integrity, edema, etc.): Pt reports that she is the oldest of 3 siblings and she looks out for them. Pt reports that she is currently caring for one sister who lives with her and is going to transition her to a facility in the near future.     Exercises     Assessment/Plan    PT Assessment Patent does not need any further PT services  PT Problem List Decreased strength;Decreased mobility;Decreased balance       PT Treatment Interventions      PT Goals (Current goals can be found in the Care Plan section)  Acute Rehab PT Goals PT Goal Formulation: All assessment and education complete, DC therapy    Frequency     Barriers to discharge        Co-evaluation               AM-PAC PT "6 Clicks" Daily Activity  Outcome Measure Difficulty turning over in bed  (including adjusting bedclothes, sheets and blankets)?: None Difficulty moving from lying on back to sitting on the side of the bed? : A Little Difficulty sitting down on and standing up from a chair with arms (e.g., wheelchair, bedside commode, etc,.)?: A Little Help needed moving to and from a bed to chair (including a wheelchair)?: A Little Help needed walking in hospital room?: A Little Help needed climbing 3-5 steps with a railing? : A Little 6 Click Score: 19    End of Session Equipment Utilized During Treatment: Gait belt Activity Tolerance: Patient tolerated treatment well Patient left: in bed;Other (comment)(with tech to transfer to test) Nurse Communication: Mobility status PT Visit Diagnosis: Difficulty in walking, not elsewhere classified (R26.2);Unsteadiness on feet (R26.81);Muscle weakness (generalized) (M62.81);Pain Pain - Right/Left: Right Pain - part of body: (low back)    Time: [id number] PT Time Calculation (min) (ACUTE ONLY): 17 min   Charges:   PT Evaluation $PT Eval Low Complexity: 1 Low     PT G Codes:   PT G-Codes **NOT FOR INPATIENT CLASS** Functional Assessment Tool Used: Clinical judgement Functional Limitation: Mobility: Walking and moving around Mobility: Walking and Moving Around Current Status ([id number]): At  least 1 percent but less than 20 percent impaired, limited or restricted Mobility: Walking and Moving Around Goal Status [phone]): At least 1 percent but less than 20 percent impaired, limited or restricted Mobility: Walking and Moving Around Discharge Status [phone]): At least 1 percent but less than 20 percent impaired, limited or restricted    Reina Fuse, SPT  Reina Fuse 11/05/2017, 9:39 AM

## 2017-11-05 NOTE — Progress Notes (Signed)
Patient given discharge information and able to teach back and verbalize understanding. IV and tele removed. No new concerns or questions.

## 2019-03-02 IMAGING — CT CT HEAD CODE STROKE
4 series · 16 of 47 positions shown, 18 images · non-contrast
Comparison: None.

CLINICAL DATA: Code stroke.  LEFT-sided weakness and facial droop.

EXAM:
CT HEAD WITHOUT CONTRAST
TECHNIQUE: Contiguous axial images were obtained from the base of the skull
through the vertex without intravenous contrast.

[Series 3: head 5.0 st · axial · 0.40mm/px · z∈[-82,+33]mm · 7 of 31 slices shown, 9 images]
[im 4/31  brain]
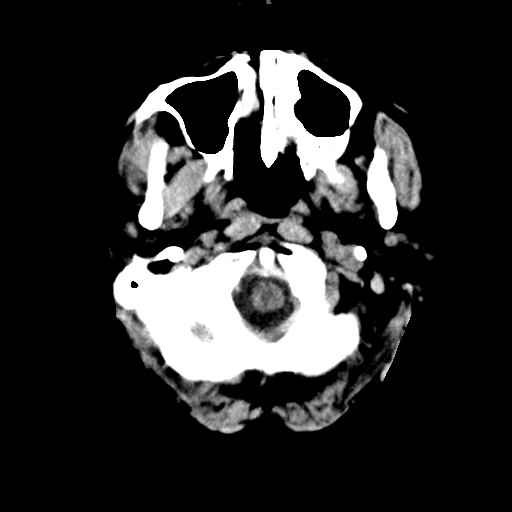
[im 4/31  bone]
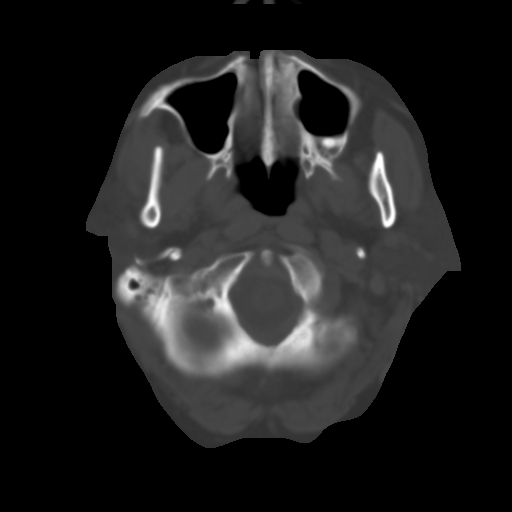
[im 8/31  brain]
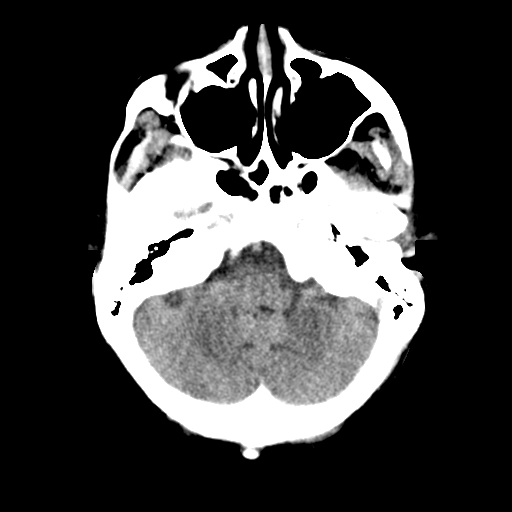
[im 12/31  brain]
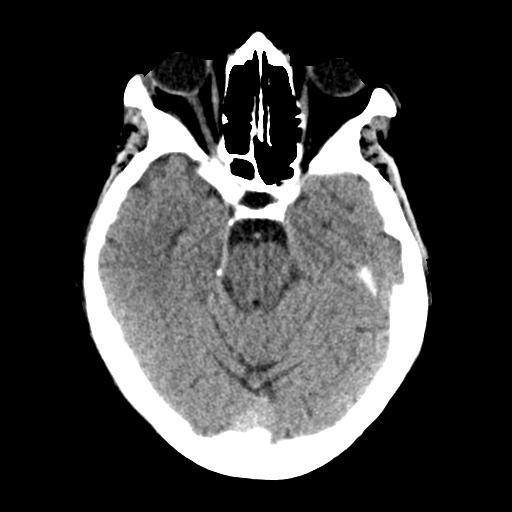
[im 16/31  brain]
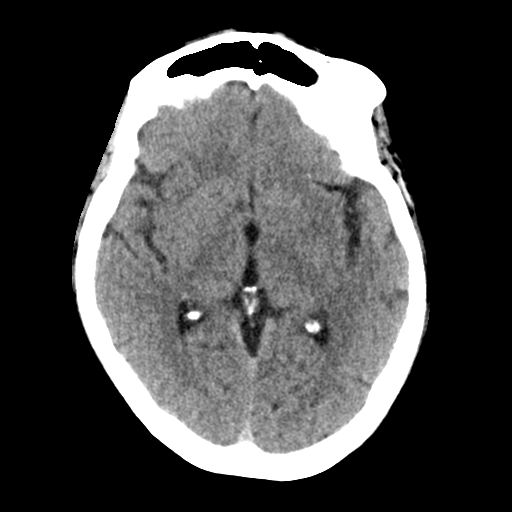
[im 19/31  brain]
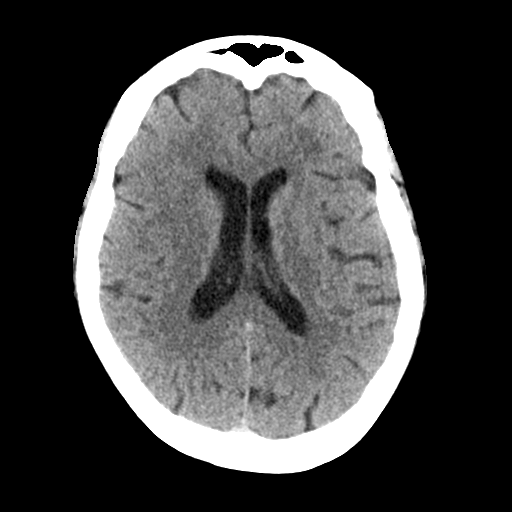
[im 19/31  bone]
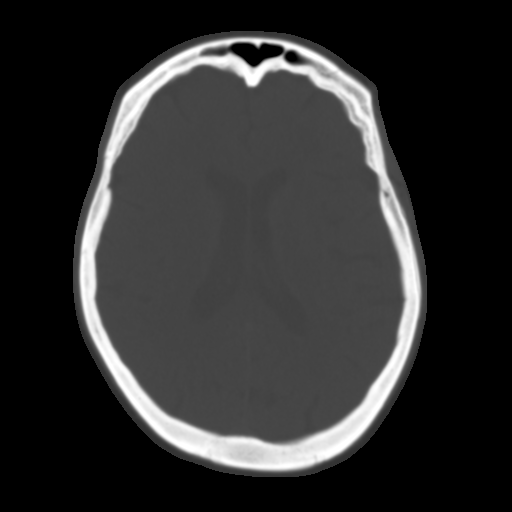
[im 23/31  brain]
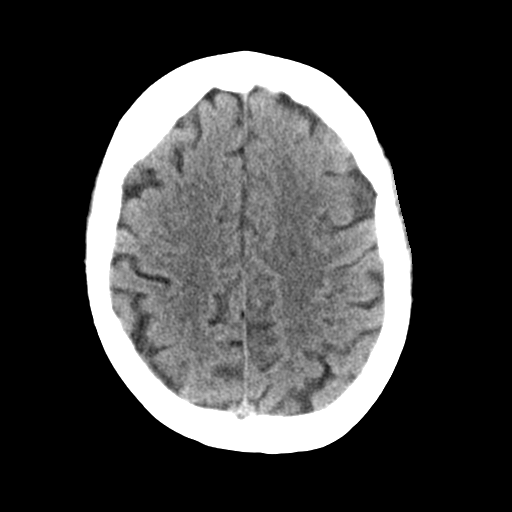
[im 27/31  brain]
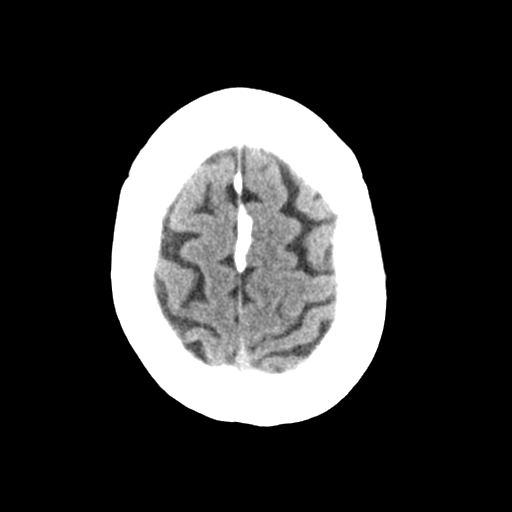

[Series 4: head 2.0 bone · axial · 0.40mm/px · z∈[-83,-53]mm · 3 of 77 slices shown]
[im 8/77  bone]
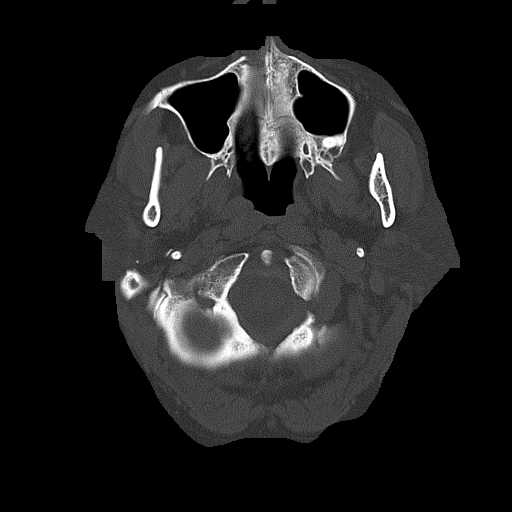
[im 16/77  bone]
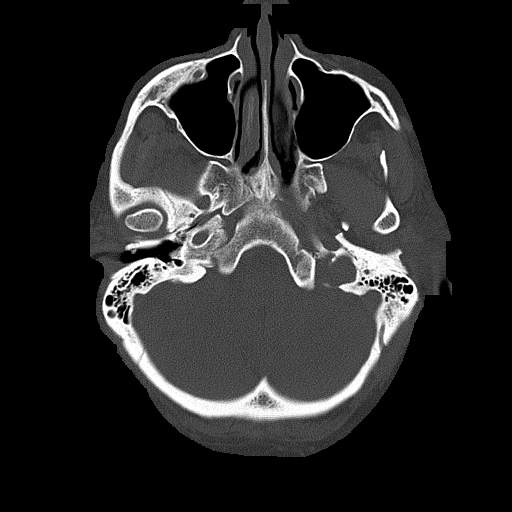
[im 23/77  bone]
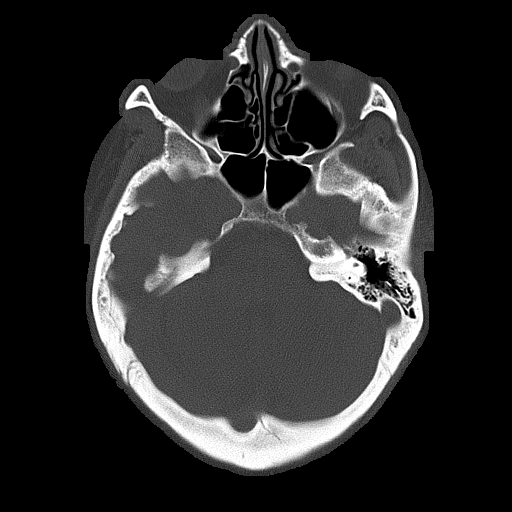

[Series 5: head 3.0 cor st · coronal · 0.30mm/px · 3 of 67 slices shown]
[im 23/67  brain]
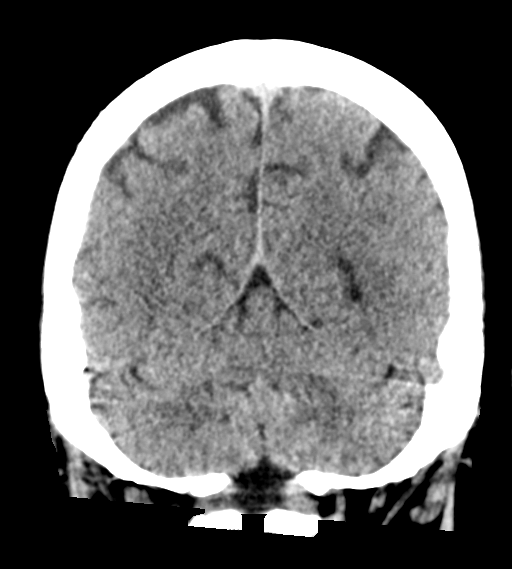
[im 30/67  brain]
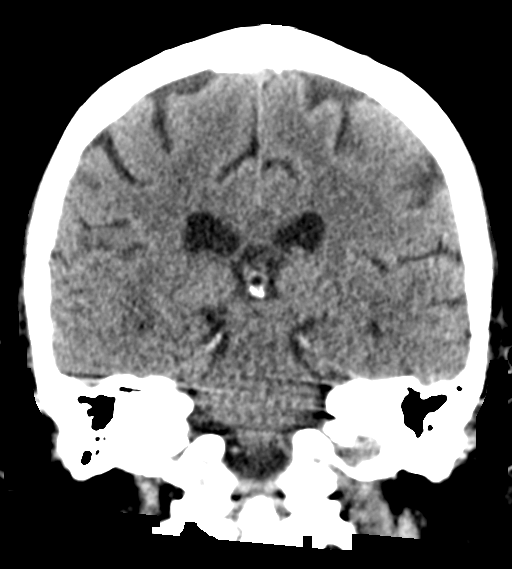
[im 37/67  brain]
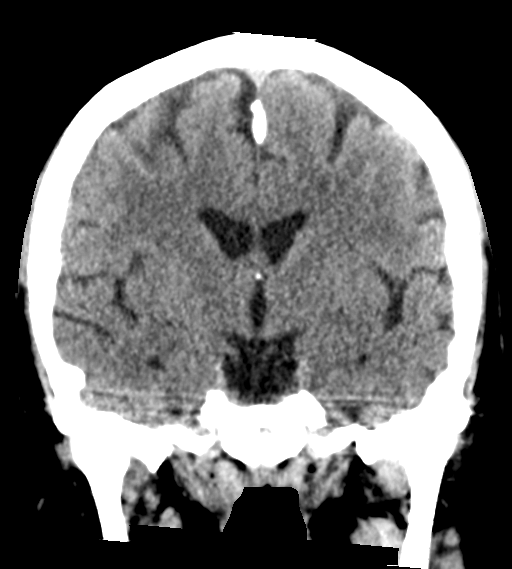

[Series 6: head 3.0 sag st · sagittal · 0.33mm/px · 3 of 54 slices shown]
[im 18/54  brain]
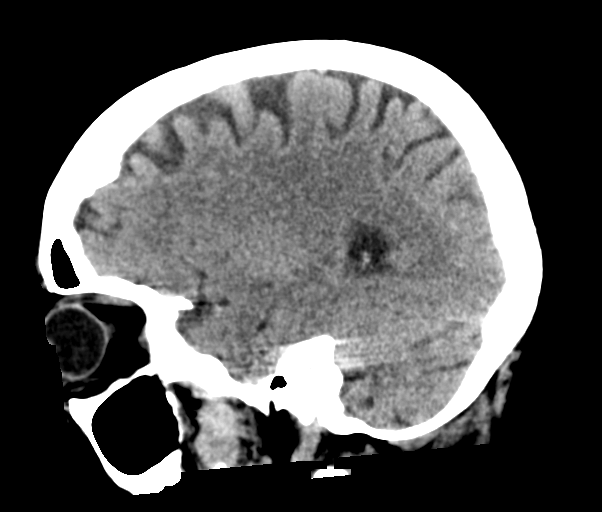
[im 27/54  brain]
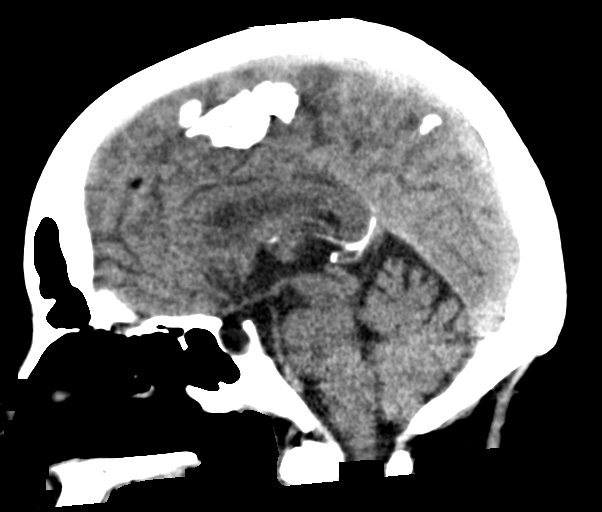
[im 36/54  brain]
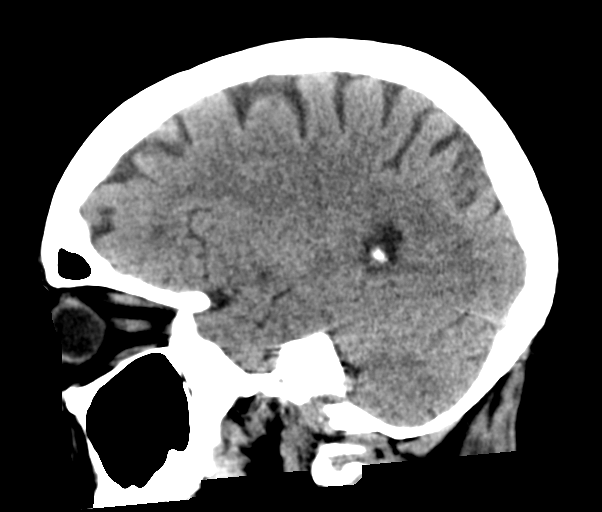

[16 of 47 positions shown; findings below may reference images not displayed]

FINDINGS: BRAIN: No intraparenchymal hemorrhage, mass effect nor midline
shift. The ventricles and sulci are normal for age. Patchy
supratentorial white matter hypodensities less than expected for
patient's age, though non-specific are most compatible with chronic
small vessel ischemic disease. Old appearing small RIGHT cerebellar
infarct. No acute large vascular territory infarcts. No abnormal
extra-axial fluid collections. Basal cisterns are patent.

VASCULAR: Mild calcific atherosclerosis of the carotid siphons. No
dense MCA.

SKULL: No skull fracture. No significant scalp soft tissue swelling.

SINUSES/ORBITS: The mastoid air-cells and included paranasal sinuses
are well-aerated.The included ocular globes and orbital contents are
non-suspicious.

OTHER: None.

ASPECTS (Alberta Stroke Program Early CT Score)

- Ganglionic level infarction (caudate, lentiform nuclei, internal
capsule, insula, M1-M3 cortex): 7

- Supraganglionic infarction (M4-M6 cortex): 3

Total score (0-10 with 10 being normal): 10
IMPRESSION: 1. Old appearing small RIGHT cerebellar infarct, otherwise normal
noncontrast CT HEAD for age.
2. ASPECTS is 10.
3. Acute findings text paged to Dr.Tien, Neurology via [employer]
secure system on 11/04/2017 at [DATE].
